# Patient Record
Sex: Male | Born: 1970 | Race: Black or African American | Hispanic: No | Marital: Married | Smoking: Current every day smoker
Health system: Southern US, Community
[De-identification: ages and names within clinical notes are randomized; demographics above are authoritative.]

## PROBLEM LIST (undated history)

## (undated) DIAGNOSIS — F1290 Cannabis use, unspecified, uncomplicated: Secondary | ICD-10-CM

## (undated) DIAGNOSIS — I10 Essential (primary) hypertension: Secondary | ICD-10-CM

## (undated) DIAGNOSIS — F1010 Alcohol abuse, uncomplicated: Secondary | ICD-10-CM

## (undated) DIAGNOSIS — R002 Palpitations: Secondary | ICD-10-CM

## (undated) DIAGNOSIS — Z720 Tobacco use: Secondary | ICD-10-CM

## (undated) DIAGNOSIS — I340 Nonrheumatic mitral (valve) insufficiency: Secondary | ICD-10-CM

## (undated) HISTORY — DX: Palpitations: R00.2

## (undated) HISTORY — DX: Alcohol abuse, uncomplicated: F10.10

## (undated) HISTORY — DX: Tobacco use: Z72.0

## (undated) HISTORY — DX: Cannabis use, unspecified, uncomplicated: F12.90

## (undated) HISTORY — DX: Nonrheumatic mitral (valve) insufficiency: I34.0

---

## 2019-10-09 ENCOUNTER — Other Ambulatory Visit: Payer: Self-pay

## 2019-10-09 ENCOUNTER — Emergency Department: Payer: Self-pay | Admitting: Anesthesiology

## 2019-10-09 ENCOUNTER — Encounter: Payer: Self-pay | Admitting: Emergency Medicine

## 2019-10-09 ENCOUNTER — Inpatient Hospital Stay: Payer: Self-pay

## 2019-10-09 ENCOUNTER — Inpatient Hospital Stay
Admission: EM | Admit: 2019-10-09 | Discharge: 2019-10-15 | DRG: 915 | Disposition: A | Discharge status: Home / Self Care | Payer: Self-pay | Attending: Internal Medicine | Admitting: Internal Medicine

## 2019-10-09 ENCOUNTER — Encounter
Admission: EM | Disposition: A | Discharge status: Home / Self Care | Payer: Self-pay | Source: Home / Self Care | Attending: Pulmonary Disease

## 2019-10-09 DIAGNOSIS — X58XXXA Exposure to other specified factors, initial encounter: Secondary | ICD-10-CM | POA: Diagnosis present

## 2019-10-09 DIAGNOSIS — Z781 Physical restraint status: Secondary | ICD-10-CM

## 2019-10-09 DIAGNOSIS — Z20822 Contact with and (suspected) exposure to covid-19: Secondary | ICD-10-CM | POA: Diagnosis present

## 2019-10-09 DIAGNOSIS — J690 Pneumonitis due to inhalation of food and vomit: Secondary | ICD-10-CM | POA: Diagnosis not present

## 2019-10-09 DIAGNOSIS — I10 Essential (primary) hypertension: Secondary | ICD-10-CM

## 2019-10-09 DIAGNOSIS — T464X5A Adverse effect of angiotensin-converting-enzyme inhibitors, initial encounter: Secondary | ICD-10-CM

## 2019-10-09 DIAGNOSIS — T783XXA Angioneurotic edema, initial encounter: Principal | ICD-10-CM | POA: Diagnosis present

## 2019-10-09 DIAGNOSIS — R1310 Dysphagia, unspecified: Secondary | ICD-10-CM | POA: Diagnosis present

## 2019-10-09 DIAGNOSIS — F1210 Cannabis abuse, uncomplicated: Secondary | ICD-10-CM | POA: Diagnosis present

## 2019-10-09 DIAGNOSIS — F17200 Nicotine dependence, unspecified, uncomplicated: Secondary | ICD-10-CM | POA: Diagnosis present

## 2019-10-09 DIAGNOSIS — F10239 Alcohol dependence with withdrawal, unspecified: Secondary | ICD-10-CM | POA: Diagnosis present

## 2019-10-09 DIAGNOSIS — J9600 Acute respiratory failure, unspecified whether with hypoxia or hypercapnia: Secondary | ICD-10-CM | POA: Diagnosis present

## 2019-10-09 HISTORY — PX: INTUBATION-ENDOTRACHEAL WITH TRACHEOSTOMY STANDBY: SHX6592

## 2019-10-09 HISTORY — DX: Essential (primary) hypertension: I10

## 2019-10-09 LAB — RESPIRATORY PANEL BY RT PCR (FLU A&B, COVID)
Influenza A by PCR: NEGATIVE
Influenza B by PCR: NEGATIVE
SARS Coronavirus 2 by RT PCR: NEGATIVE

## 2019-10-09 LAB — BLOOD GAS, ARTERIAL
Acid-base deficit: 0 mmol/L (ref 0.0–2.0)
Bicarbonate: 26.4 mmol/L (ref 20.0–28.0)
FIO2: 0.4
MECHVT: 500 mL
O2 Saturation: 98.8 %
PEEP: 5 cmH2O
Patient temperature: 37
RATE: 15 resp/min
pCO2 arterial: 49 mmHg — ABNORMAL HIGH (ref 32.0–48.0)
pH, Arterial: 7.34 — ABNORMAL LOW (ref 7.350–7.450)
pO2, Arterial: 131 mmHg — ABNORMAL HIGH (ref 83.0–108.0)

## 2019-10-09 LAB — BASIC METABOLIC PANEL
Anion gap: 13 (ref 5–15)
BUN: 14 mg/dL (ref 6–20)
CO2: 24 mmol/L (ref 22–32)
Calcium: 9.6 mg/dL (ref 8.9–10.3)
Chloride: 98 mmol/L (ref 98–111)
Creatinine, Ser: 0.76 mg/dL (ref 0.61–1.24)
GFR calc Af Amer: >60 mL/min (ref >60)
GFR calc non Af Amer: >60 mL/min (ref >60)
Glucose, Bld: 111 mg/dL — ABNORMAL HIGH (ref 70–99)
Potassium: 3.6 mmol/L (ref 3.5–5.1)
Sodium: 135 mmol/L (ref 135–145)

## 2019-10-09 LAB — CBC
HCT: 39.5 % (ref 39.0–52.0)
Hemoglobin: 13.6 g/dL (ref 13.0–17.0)
MCH: 30.6 pg (ref 26.0–34.0)
MCHC: 34.4 g/dL (ref 30.0–36.0)
MCV: 89 fL (ref 80.0–100.0)
Platelets: 162 10*3/uL (ref 150–400)
RBC: 4.44 MIL/uL (ref 4.22–5.81)
RDW: 15.5 % (ref 11.5–15.5)
WBC: 6.2 10*3/uL (ref 4.0–10.5)
nRBC: 0 % (ref 0.0–0.2)

## 2019-10-09 LAB — GLUCOSE, CAPILLARY: Glucose-Capillary: 145 mg/dL — ABNORMAL HIGH (ref 70–99)

## 2019-10-09 LAB — MRSA PCR SCREENING: MRSA by PCR: NEGATIVE

## 2019-10-09 SURGERY — INTUBATION-ENDOTRACHEAL WITH TRACHEOSTOMY STANDBY
Anesthesia: General | Site: Mouth

## 2019-10-09 MED ORDER — MIDAZOLAM HCL 2 MG/2ML IJ SOLN
2.0000 mg | Freq: Once | INTRAMUSCULAR | Status: AC
  Administered 2019-10-09: 2 mg via INTRAVENOUS
  Filled 2019-10-09: qty 2

## 2019-10-09 MED ORDER — HEPARIN SODIUM (PORCINE) 5000 UNIT/ML IJ SOLN: 5000.0000 [IU] | Freq: Three times a day (TID) | INTRAMUSCULAR | Status: DC

## 2019-10-09 MED ORDER — METHYLPREDNISOLONE SODIUM SUCC 125 MG IJ SOLR
80.0000 mg | Freq: Two times a day (BID) | INTRAMUSCULAR | Status: DC
  Administered 2019-10-09: 80 mg via INTRAVENOUS
  Filled 2019-10-09: qty 2

## 2019-10-09 MED ORDER — ORAL CARE MOUTH RINSE
15.0000 mL | OROMUCOSAL | Status: DC
  Administered 2019-10-09 – 2019-10-14 (×39): 15 mL via OROMUCOSAL

## 2019-10-09 MED ORDER — PROPOFOL 1000 MG/100ML IV EMUL
0.0000 ug/kg/min | INTRAVENOUS | Status: DC
  Administered 2019-10-09 – 2019-10-10 (×2): 50 ug/kg/min via INTRAVENOUS
  Filled 2019-10-09 (×2): qty 100

## 2019-10-09 MED ORDER — DEXAMETHASONE SODIUM PHOSPHATE 10 MG/ML IJ SOLN
INTRAMUSCULAR | Status: DC | PRN
  Administered 2019-10-09: 20 mg via INTRAVENOUS

## 2019-10-09 MED ORDER — FAMOTIDINE IN NACL 20-0.9 MG/50ML-% IV SOLN
20.0000 mg | INTRAVENOUS | Status: DC
  Administered 2019-10-09 – 2019-10-14 (×5): 20 mg via INTRAVENOUS
  Filled 2019-10-09 (×5): qty 50

## 2019-10-09 MED ORDER — FENTANYL CITRATE (PF) 100 MCG/2ML IJ SOLN
50.0000 ug | INTRAMUSCULAR | Status: DC | PRN
  Administered 2019-10-09 (×2): 100 ug via INTRAVENOUS
  Administered 2019-10-09: 200 ug via INTRAVENOUS
  Filled 2019-10-09: qty 4
  Filled 2019-10-09 (×2): qty 2

## 2019-10-09 MED ORDER — FENTANYL CITRATE (PF) 100 MCG/2ML IJ SOLN
INTRAMUSCULAR | Status: DC | PRN
  Administered 2019-10-09: 100 ug via INTRAVENOUS

## 2019-10-09 MED ORDER — DIPHENHYDRAMINE HCL 50 MG/ML IJ SOLN
50.0000 mg | Freq: Once | INTRAMUSCULAR | Status: AC
  Administered 2019-10-09: 18:00:00 50 mg via INTRAVENOUS

## 2019-10-09 MED ORDER — ALBUTEROL SULFATE (2.5 MG/3ML) 0.083% IN NEBU: 2.5000 mg | INHALATION_SOLUTION | RESPIRATORY_TRACT | Status: DC | PRN

## 2019-10-09 MED ORDER — MIDAZOLAM HCL 2 MG/2ML IJ SOLN
INTRAMUSCULAR | Status: DC | PRN
  Administered 2019-10-09: 2 mg via INTRAVENOUS

## 2019-10-09 MED ORDER — PROPOFOL 10 MG/ML IV BOLUS
INTRAVENOUS | Status: AC
  Filled 2019-10-09: qty 20

## 2019-10-09 MED ORDER — OXYMETAZOLINE HCL 0.05 % NA SOLN
3.0000 | Freq: Once | NASAL | Status: AC
  Administered 2019-10-09: 3 via NASAL
  Filled 2019-10-09: qty 30

## 2019-10-09 MED ORDER — DEXMEDETOMIDINE HCL IN NACL 80 MCG/20ML IV SOLN
INTRAVENOUS | Status: AC
  Filled 2019-10-09: qty 20

## 2019-10-09 MED ORDER — PROPOFOL 1000 MG/100ML IV EMUL
INTRAVENOUS | Status: AC
  Administered 2019-10-09: 20 ug/kg/min via INTRAVENOUS
  Filled 2019-10-09: qty 100

## 2019-10-09 MED ORDER — MIDAZOLAM HCL 2 MG/2ML IJ SOLN
INTRAMUSCULAR | Status: AC
  Filled 2019-10-09: qty 2

## 2019-10-09 MED ORDER — FENTANYL 2500MCG IN NS 250ML (10MCG/ML) PREMIX INFUSION
INTRAVENOUS | Status: AC
  Administered 2019-10-09: 100 ug/h via INTRAVENOUS
  Filled 2019-10-09: qty 250

## 2019-10-09 MED ORDER — CHLORHEXIDINE GLUCONATE 0.12% ORAL RINSE (MEDLINE KIT)
15.0000 mL | Freq: Two times a day (BID) | OROMUCOSAL | Status: DC
  Administered 2019-10-09 – 2019-10-13 (×8): 15 mL via OROMUCOSAL

## 2019-10-09 MED ORDER — SUCCINYLCHOLINE CHLORIDE 20 MG/ML IJ SOLN
INTRAMUSCULAR | Status: DC | PRN
  Administered 2019-10-09: 120 mg via INTRAVENOUS

## 2019-10-09 MED ORDER — OXYMETAZOLINE HCL 0.05 % NA SOLN
NASAL | Status: AC
  Filled 2019-10-09: qty 30

## 2019-10-09 MED ORDER — METHYLPREDNISOLONE SODIUM SUCC 125 MG IJ SOLR
125.0000 mg | INTRAMUSCULAR | Status: AC
  Administered 2019-10-09: 125 mg via INTRAVENOUS

## 2019-10-09 MED ORDER — FENTANYL CITRATE (PF) 100 MCG/2ML IJ SOLN: 50.0000 ug | INTRAMUSCULAR | Status: DC | PRN

## 2019-10-09 MED ORDER — CHLORHEXIDINE GLUCONATE CLOTH 2 % EX PADS
6.0000 | MEDICATED_PAD | Freq: Every day | CUTANEOUS | Status: DC
  Administered 2019-10-09 – 2019-10-15 (×6): 6 via TOPICAL

## 2019-10-09 MED ORDER — FENTANYL CITRATE (PF) 100 MCG/2ML IJ SOLN
INTRAMUSCULAR | Status: AC
  Filled 2019-10-09: qty 2

## 2019-10-09 MED ORDER — EPINEPHRINE 0.3 MG/0.3ML IJ SOAJ
0.3000 mg | Freq: Once | INTRAMUSCULAR | Status: AC
  Administered 2019-10-09: 0.3 mg via INTRAMUSCULAR

## 2019-10-09 MED ORDER — LACTATED RINGERS IV SOLN: INTRAVENOUS | Status: DC | PRN

## 2019-10-09 MED ORDER — FENTANYL 2500MCG IN NS 250ML (10MCG/ML) PREMIX INFUSION
0.0000 ug/h | INTRAVENOUS | Status: DC
  Administered 2019-10-10 (×2): 400 ug/h via INTRAVENOUS
  Administered 2019-10-10: 350 ug/h via INTRAVENOUS
  Administered 2019-10-11 – 2019-10-13 (×10): 400 ug/h via INTRAVENOUS
  Filled 2019-10-09 (×13): qty 250

## 2019-10-09 MED ORDER — SODIUM CHLORIDE 0.9 % IV SOLN
INTRAVENOUS | Status: DC
  Administered 2019-10-09: 21:00:00 50 mL/h via INTRAVENOUS

## 2019-10-09 MED ORDER — MIDAZOLAM HCL 2 MG/2ML IJ SOLN: 2.0000 mg | Freq: Once | INTRAMUSCULAR | Status: AC

## 2019-10-09 MED ORDER — MIDAZOLAM HCL 2 MG/2ML IJ SOLN
INTRAMUSCULAR | Status: AC
  Administered 2019-10-09: 2 mg via INTRAVENOUS
  Filled 2019-10-09: qty 2

## 2019-10-09 MED ORDER — ROCURONIUM BROMIDE 100 MG/10ML IV SOLN
INTRAVENOUS | Status: DC | PRN
  Administered 2019-10-09: 50 mg via INTRAVENOUS

## 2019-10-09 MED ORDER — PROPOFOL 10 MG/ML IV BOLUS
INTRAVENOUS | Status: DC | PRN
  Administered 2019-10-09: 200 mg via INTRAVENOUS

## 2019-10-09 SURGICAL SUPPLY — 15 items
BLADE SURG 15 STRL LF DISP TIS (BLADE) ×1 IMPLANT
BLADE SURG 15 STRL SS (BLADE)
BLADE SURG SZ11 CARB STEEL (BLADE) ×1 IMPLANT
CANISTER SUCT 1200ML W/VALVE (MISCELLANEOUS) ×1 IMPLANT
COVER WAND RF STERILE (DRAPES) ×1 IMPLANT
ELECT REM PT RETURN 9FT ADLT (ELECTROSURGICAL) ×2
ELECTRODE REM PT RTRN 9FT ADLT (ELECTROSURGICAL) ×1 IMPLANT
GLOVE BIO SURGEON STRL SZ7.5 (GLOVE) ×2 IMPLANT
GOWN STRL REUS W/ TWL LRG LVL3 (GOWN DISPOSABLE) ×2 IMPLANT
GOWN STRL REUS W/TWL LRG LVL3 (GOWN DISPOSABLE) ×4
KIT TURNOVER KIT A (KITS) ×2 IMPLANT
PACK HEAD/NECK (MISCELLANEOUS) ×2 IMPLANT
SHEARS FOC LG CVD HARMONIC 17C (MISCELLANEOUS) IMPLANT
SPONGE DRAIN TRACH 4X4 STRL 2S (GAUZE/BANDAGES/DRESSINGS) ×1 IMPLANT
TUBE TRACH SHILEY  6 DIST  CUF (TUBING) IMPLANT

## 2019-10-09 NOTE — Anesthesia Procedure Notes (Signed)
Procedure Name: Intubation Date/Time: 10/09/2019 7:13 PM Performed by: Waldo Laine, CRNA Pre-anesthesia Checklist: Patient identified, Patient being monitored, Timeout performed, Emergency Drugs available and Suction available Patient Re-evaluated:Patient Re-evaluated prior to induction Oxygen Delivery Method: Circle system utilized Preoxygenation: Pre-oxygenation with 100% oxygen Induction Type: IV induction Ventilation: Mask ventilation with difficulty Laryngoscope Size: 3 and Glidescope Grade View: Grade I Tube type: Oral Tube size: 7.0 mm Number of attempts: 1 Airway Equipment and Method: Stylet and Bougie stylet Placement Confirmation: ETT inserted through vocal cords under direct vision,  positive ETCO2 and breath sounds checked- equal and bilateral Secured at: 22 cm Tube secured with: Tape Dental Injury: Teeth and Oropharynx as per pre-operative assessment  Difficulty Due To: Difficult Airway-  due to edematous airway

## 2019-10-09 NOTE — Progress Notes (Signed)
eLink Physician-Brief Progress Note Patient Name: Michael Ingram DOB: [birth year] MRN: [id number]   Date of Service  10/09/2019  HPI/Events of Note  Pt intubated for angioedema and now sub-optimally sedated on Propofol, dyssynchronous despite PRN Fentanyl pushes.  eICU Interventions  Fentanyl infusion ordered. Versed 2 mg iv x 1 PRN extreme agitation prior to Fentanyl fully kicking in. Soft bilateral wrist restraints ordered to prevent self-extubation.        Migdalia Dk 10/09/2019, 9:46 PM

## 2019-10-09 NOTE — Progress Notes (Addendum)
Pt wife has called to check on patient and was updated and informed about the non violent restraints applied. Wife also mentioned that patient drinks 1/2 a 5th of liquor a day.

## 2019-10-09 NOTE — Transfer of Care (Signed)
Immediate Anesthesia Transfer of Care Note  Patient: Michael Ingram  Procedure(s) Performed: INTUBATION-ENDOTRACHEAL WITH TRACHEOSTOMY STANDBY (N/A Mouth)  Patient Location: PACU and ICU  Anesthesia Type:General  Level of Consciousness: Patient remains intubated per anesthesia plan  Airway & Oxygen Therapy: Patient placed on Ventilator (see vital sign flow sheet for setting)  Post-op Assessment: Report given to RN and Post -op Vital signs reviewed and stable  Post vital signs: Reviewed and stable  Last Vitals:  Vitals Value Taken Time  BP    Temp    Pulse 87 10/09/19 1955  Resp 13 10/09/19 1955  SpO2 100 % 10/09/19 1955  Vitals shown include unvalidated device data.  Last Pain:  Vitals:   10/09/19 1813  TempSrc: Oral  PainSc:          Complications: No apparent anesthesia complications

## 2019-10-09 NOTE — OR Nursing (Signed)
Physician obtained emergent consent

## 2019-10-09 NOTE — ED Triage Notes (Signed)
Pt in via POV, with possible allergic reaction to unknown source.  Pt reports feeling as if his throat is closing, having difficulty speaking in full sentences, voice hoarse.  Pt roomed at this time.  Denies hx of same.

## 2019-10-09 NOTE — ED Provider Notes (Signed)
[facility] Emergency Department Provider Note   ____________________________________________   First MD Initiated Contact with Patient 10/09/19 1806     (approximate)  I have reviewed the triage vital signs and the nursing notes.   HISTORY  Chief Complaint Allergic Reaction  EM caveat: Airway compromise, distress for acuity  HPI Michael Ingram is a 49 y.o. male   reports about an hour ago he started having difficulty swallowing.  Thought it might be something due to eaten a different type of spice.  He is having difficulty swallowing feels like he has to spit up.  And feels swollen around his neck.  Also noticed one of his fingers and swollen  Never had this happen before.  Restarted blood pressure medicine today, wife reports that his lisinopril which she had not taken blood pressure medicine for months previous  No chest pain or trouble breathing.  No itching.  Reports biggest issue seems to be difficulty swallowing and feeling like his throat is swollen  Past Medical History:  Diagnosis Date  . Hypertension     There are no problems to display for this patient.   History reviewed. No pertinent surgical history.  Prior to Admission medications   Not on File    Allergies Patient has no known allergies.  No family history on file.  Social History Social History   Tobacco Use  . Smoking status: Current Every Day Smoker    Packs/day: 0.50    Types: Cigarettes  . Smokeless tobacco: Never Used  Substance Use Topics  . Alcohol use: Yes  . Drug use: Yes    Types: Marijuana    Review of Systems  Denies exposure to coronavirus.  No cough fevers chills or other symptoms preceding this.   ____________________________________________   PHYSICAL EXAM:  VITAL SIGNS: ED Triage Vitals  Enc Vitals Group     BP 10/09/19 1802 (!) 194/109     Pulse Rate 10/09/19 1802 (!) 104     Resp 10/09/19 1802 20     Temp 10/09/19 1813 98.2 F  (36.8 C)     Temp Source 10/09/19 1813 Oral     SpO2 10/09/19 1802 100 %     Weight 10/09/19 1803 175 lb (79.4 kg)     Height 10/09/19 1803 5\' 11"  (1.803 m)     Head Circumference --      Peak Flow --      Pain Score 10/09/19 1803 0     Pain Loc --      Pain Edu? --      Excl. in GC? --     Constitutional: Alert and oriented.  Sitting up, appears in some distress spitting up small amounts of spit but respirations are even and unlabored without stridor Eyes: Conjunctivae are normal. Head: Atraumatic. Nose: No congestion/rhinnorhea. Mouth/Throat: Mucous membranes are moist.  Uvula appears slightly edematous Neck: No stridor.  Anterior neck seems to have some slight fullness about it Cardiovascular: Normal rate, regular rhythm. Grossly normal heart sounds.  Good peripheral circulation. Respiratory: Normal respiratory effort.  No retractions. Lungs CTAB. Gastrointestinal: Soft and nontender. No distention. Musculoskeletal: No lower extremity tenderness nor edema.  He has 1 finger pad on his left hand that seems just slightly edematous that he is noticed as well occurring in the same timeframe Neurologic:  Normal speech and language. No gross focal neurologic deficits are appreciated.  Skin:  Skin is warm, dry and intact. No rash noted.  No urticaria or  rash. Psychiatric: Mood and affect are normal. Speech and behavior are normal.  ____________________________________________   LABS (all labs ordered are listed, but only abnormal results are displayed)  Labs Reviewed  BASIC METABOLIC PANEL - Abnormal; Notable for the following components:      Result Value   Glucose, Bld 111 (*)    All other components within normal limits  RESPIRATORY PANEL BY RT PCR (FLU A&B, COVID)  CBC   ____________________________________________  EKG   ____________________________________________  RADIOLOGY   ____________________________________________   PROCEDURES  Procedure(s) performed:  None  Procedures  Critical Care performed: Yes, see critical care note(s)  CRITICAL CARE Performed by: Delman Kitten   Total critical care time: 35 minutes  Critical care time was exclusive of separately billable procedures and treating other patients.  Critical care was necessary to treat or prevent imminent or life-threatening deterioration.  Critical care was time spent personally by me on the following activities: development of treatment plan with patient and/or surrogate as well as nursing, discussions with consultants, evaluation of patient's response to treatment, examination of patient, obtaining history from patient or surrogate, ordering and performing treatments and interventions, ordering and review of laboratory studies, ordering and review of radiographic studies, pulse oximetry and re-evaluation of patient's condition.  ____________________________________________   INITIAL IMPRESSION / ASSESSMENT AND PLAN / ED COURSE  Pertinent labs & imaging results that were available during my care of the patient were reviewed by me and considered in my medical decision making (see chart for details).   Acute upper airway airway difficulty.  Denies foreign body sensation but reports any swelling feeling.  Does appear to have evidence of at least some angioedema at the uvula.  Treated with typical treatments for anaphylaxis, though once full history is gathered and able to speak to his wife it appears that he is started on lisinopril and most likely this is ACE inhibitor induced angioedema  No acute cardiac or pulmonary symptoms.  No neurologic symptoms.  Clinical Course as of Oct 08 1838  Tue Oct 09, 2019  5329 Although the patient is respirating sufficiently, I am concerned as he is obviously having some difficulty swallowing and spitting up slightly.  He reports to me that he thinks the blood pressure medicine that he just restarted today after being off of for a while might be  lisinopril.  I am highly concerned about the possibility of angioedema possibly induced by ACE inhibitor, as he does not have any urticaria, denies itching he has clear lung sounds and no known previous allergies to any foods or medications.  Treating for possible anaphylaxis nonetheless, though angioedema seems high in the differential.  Airway cart and supplies placed outside the doorway in case the patient does decompensate.  Stat consult to ENT has been placed at this time as well.   [MQ]  1813 Dr. Tami Ribas (ENT) coming for emergent consult.    [MQ]  1829 Dr. Tami Ribas at bedside   [MQ]    Clinical Course User Index [MQ] Delman Kitten, MD   ----------------------------------------- 6:51 PM on 10/09/2019 -----------------------------------------  Patient reports that his congestion and difficulty breathing seem to be slightly relieved.  He sitting up respirating comfortably he is going to the operating room with Dr. Tami Ribas for upper airway evaluation and possible intubation.  I discussed with Dr. Tami Ribas who is elected to take the patient to the OR for further evaluation.  I am in agreement that at this time the patient does not need emergent intubation in  the emergency room but rather needs directed care and consultation with Dr. Jenne Campus in the anesthesia team as he does appear to be stable enough to endure evaluation in the OR and not needing emergent Er inbtubation at at this time, especially as the patient reporting some improvement and examination appears to demonstrate stability during his ED timeframe  I have paged critical care medicine at [phone]  to notify them of patient going to the OR and probable need for admission to their service. CCM returned paged and case discussed with Dr. Pricilla Loveless was evaluated in Emergency Department on 10/09/2019 for the symptoms described in the history of present illness. He was evaluated in the context of the global COVID-19 pandemic, which  necessitated consideration that the patient might be at risk for infection with the SARS-CoV-2 virus that causes COVID-19. Institutional protocols and algorithms that pertain to the evaluation of patients at risk for COVID-19 are in a state of rapid change based on information released by regulatory bodies including the CDC and federal and state organizations. These policies and algorithms were followed during the patient's care in the ED.  Urgency did not allow for return of Covid test prior to going to the OR but the patient does deny exposure or symptoms of coronavirus disease to my evaluation.  ICU consultation discussed with Dr. Tim Lair at 730pm (some delay as CCM pager was not able to be answered)  ____________________________________________   FINAL CLINICAL IMPRESSION(S) / ED DIAGNOSES  Final diagnoses:  Angioedema, initial encounter        Note:  This document was prepared using Dragon voice recognition software and may include unintentional dictation errors       Sharyn Creamer, MD 10/09/19 2002

## 2019-10-09 NOTE — Progress Notes (Signed)
eLink Physician-Brief Progress Note Patient Name: Michael Ingram DOB: [birth year] MRN: [id number]   Date of Service  10/09/2019  HPI/Events of Note  New pt admitted for angioedema likely r/t lisinopril, intubated emergently by anesthesia. Notes reviewed.  PCCM MD en route to admit, bedside team calling for sedation, vent orders post intubation.   eICU Interventions  PAD sedation protocol  Vent orders  Solumedrol, pepcid      Intervention Category Evaluation Type: New Patient Evaluation  Danford Bad 10/09/2019, 8:41 PM

## 2019-10-09 NOTE — Anesthesia Preprocedure Evaluation (Signed)
Anesthesia Evaluation  Patient identified by MRN, date of birth, ID band Patient awake    Reviewed: Allergy & Precautions, NPO status , Patient's Chart, lab work & pertinent test results  Airway Mallampati: III     Mouth opening: Limited Mouth Opening  Dental  (+) Caps, Chipped   Pulmonary Current Smoker,    Pulmonary exam normal        Cardiovascular hypertension, Pt. on medications Normal cardiovascular exam     Neuro/Psych negative neurological ROS  negative psych ROS   GI/Hepatic negative GI ROS, Neg liver ROS,   Endo/Other  negative endocrine ROS  Renal/GU negative Renal ROS  negative genitourinary   Musculoskeletal negative musculoskeletal ROS (+)   Abdominal Normal abdominal exam  (+)   Peds negative pediatric ROS (+)  Hematology negative hematology ROS (+)   Anesthesia Other Findings Past Medical History: No date: Hypertension  Reproductive/Obstetrics                             Anesthesia Physical Anesthesia Plan  ASA: III and emergent  Anesthesia Plan: General   Post-op Pain Management:    Induction: Intravenous  PONV Risk Score and Plan:   Airway Management Planned: Oral ETT  Additional Equipment:   Intra-op Plan:   Post-operative Plan: Post-operative intubation/ventilation  Informed Consent: I have reviewed the patients History and Physical, chart, labs and discussed the procedure including the risks, benefits and alternatives for the proposed anesthesia with the patient or authorized representative who has indicated his/her understanding and acceptance.     Dental advisory given  Plan Discussed with: CRNA  Anesthesia Plan Comments: (Talked with patient and discussed risks and also informed him and family that he would remain intubated at least overnight +, until swelling goes down.  All understood and agree with plan.)        Anesthesia Quick  Evaluation

## 2019-10-09 NOTE — Op Note (Signed)
10/09/2019  7:44 PM    Terald Sleeper  [id number]   Pre-Op Dx: Angioedema  Post-op Dx: SAME  Proc: Sedation with oral intubation  Surg:  [name]  Anes:  GOT  EBL: 0  Comp: None  Findings: Severe edema of the epiglottis and the arytenoid tissue.  Procedure: Mr. Bamber was identified in the emergency room and taken emergently to the operating room.  Topical decongestant anesthetic of a oxymetazoline and 2% lidocaine solution was instilled in each nostril approximately 1.5 cc was used on each side.  This was in preparation for possible nasal intubation fiberoptically.  The patient was then taken to the operating room with the assistance of Dr. Yves Dill and anesthesia.  Preparation was made for possible emergency tracheostomy.  The patient was then gently sedated.  The glide scope was then used to visualize the larynx; there was significant edema of the epiglottis and the arytenoid tissue.  A bougie was placed through the vocal folds into the trachea and an endotracheal tube was advanced over the bougie.  Both lungs were listened to their excellent breath sounds with good CO2 return.  The endotracheal tube was secured in position.  Dispo:   Airway secured  Plan: We will send to the ICU for continued support for angioedema as per the ICU team.  Would recommend leaving patient intubated for approximately 48 hours before any attempted extubation.  [name]  10/09/2019 7:44 PM

## 2019-10-09 NOTE — Consult Note (Signed)
Michael Ingram, [place] [id number] 1971/07/24 No att. providers found  Reason for Consult: Angioedema  HPI: 49 year old gentleman presents to the emergency room with difficulty swallowing.  He had been prescribed lisinopril earlier and had started taking it this morning.  Subsequently developed difficulty swallowing and feeling as if he had a lump in his throat.  Asked to evaluate for secure airway  Allergies: No Known Allergies  ROS: Review of systems normal other than 12 systems except per HPI.  PMH:  Past Medical History:  Diagnosis Date  . Hypertension     FH: No family history on file.  SH:  Social History   Socioeconomic History  . Marital status: Married    Spouse name: Not on file  . Number of children: Not on file  . Years of education: Not on file  . Highest education level: Not on file  Occupational History  . Not on file  Tobacco Use  . Smoking status: Current Every Day Smoker    Packs/day: 0.50    Types: Cigarettes  . Smokeless tobacco: Never Used  Substance and Sexual Activity  . Alcohol use: Yes  . Drug use: Yes    Types: Marijuana  . Sexual activity: Not on file  Other Topics Concern  . Not on file  Social History Narrative  . Not on file   Social Determinants of Health   Financial Resource Strain:   . Difficulty of Paying Living Expenses: Not on file  Food Insecurity:   . Worried About Programme researcher, broadcasting/film/video in the Last Year: Not on file  . Ran Out of Food in the Last Year: Not on file  Transportation Needs:   . Lack of Transportation (Medical): Not on file  . Lack of Transportation (Non-Medical): Not on file  Physical Activity:   . Days of Exercise per Week: Not on file  . Minutes of Exercise per Session: Not on file  Stress:   . Feeling of Stress : Not on file  Social Connections:   . Frequency of Communication with Friends and Family: Not on file  . Frequency of Social Gatherings with Friends and Family: Not on file  . Attends Religious  Services: Not on file  . Active Member of Clubs or Organizations: Not on file  . Attends Banker Meetings: Not on file  . Marital Status: Not on file  Intimate Partner Violence:   . Fear of Current or Ex-Partner: Not on file  . Emotionally Abused: Not on file  . Physically Abused: Not on file  . Sexually Abused: Not on file    PSH: History reviewed. No pertinent surgical history.  Physical  Exam: Patient awake and alert sitting up in the bed did not have any evidence of tongue edema.  Anterior nose was clear, the external ears were normal.  Lips and teeth were unremarkable.  Palpation the neck was unremarkable.  Procedure: After patient's consent the nose was decongested with oxymetazoline solution.  Approximately 5 minutes a flexible fiberoptic laryngoscope was passed through the nostril.  Examination larynx showed edema of the uvula there was also edema of the epiglottis on the lingual surface as well as edema of the arytenoid.  The true vocal folds appeared within normal limits.  A/P: Angioedema-I had a long talk with Michael Ingram and his wife based on the edema and its progression I recommended an emergent intubation in the operating room with possible tracheostomy.  We discussed risk and benefits including bleeding infection airway obstruction and death.  They are eager to proceed we will take him to the operating room emergently.   Roena Malady 10/09/2019 7:41 PM

## 2019-10-10 LAB — BASIC METABOLIC PANEL
Anion gap: 4 — ABNORMAL LOW (ref 5–15)
BUN: 18 mg/dL (ref 6–20)
CO2: 27 mmol/L (ref 22–32)
Calcium: 8.9 mg/dL (ref 8.9–10.3)
Chloride: 101 mmol/L (ref 98–111)
Creatinine, Ser: 1.17 mg/dL (ref 0.61–1.24)
GFR calc Af Amer: >60 mL/min (ref >60)
GFR calc non Af Amer: >60 mL/min (ref >60)
Glucose, Bld: 164 mg/dL — ABNORMAL HIGH (ref 70–99)
Potassium: 6.1 mmol/L — ABNORMAL HIGH (ref 3.5–5.1)
Sodium: 132 mmol/L — ABNORMAL LOW (ref 135–145)

## 2019-10-10 LAB — CBC
HCT: 37.4 % — ABNORMAL LOW (ref 39.0–52.0)
Hemoglobin: 12.8 g/dL — ABNORMAL LOW (ref 13.0–17.0)
MCH: 31 pg (ref 26.0–34.0)
MCHC: 34.2 g/dL (ref 30.0–36.0)
MCV: 90.6 fL (ref 80.0–100.0)
Platelets: 172 10*3/uL (ref 150–400)
RBC: 4.13 MIL/uL — ABNORMAL LOW (ref 4.22–5.81)
RDW: 15.3 % (ref 11.5–15.5)
WBC: 6.4 10*3/uL (ref 4.0–10.5)
nRBC: 0 % (ref 0.0–0.2)

## 2019-10-10 LAB — HIV ANTIBODY (ROUTINE TESTING W REFLEX): HIV Screen 4th Generation wRfx: NONREACTIVE

## 2019-10-10 LAB — HEPATIC FUNCTION PANEL
ALT: 33 U/L (ref 0–44)
AST: 31 U/L (ref 15–41)
Albumin: 3.6 g/dL (ref 3.5–5.0)
Alkaline Phosphatase: 55 U/L (ref 38–126)
Bilirubin, Direct: <0.1 mg/dL (ref 0.0–0.2)
Total Bilirubin: 0.5 mg/dL (ref 0.3–1.2)
Total Protein: 6.7 g/dL (ref 6.5–8.1)

## 2019-10-10 LAB — TRIGLYCERIDES: Triglycerides: 70 mg/dL (ref <150)

## 2019-10-10 LAB — POTASSIUM: Potassium: 5.3 mmol/L — ABNORMAL HIGH (ref 3.5–5.1)

## 2019-10-10 MED ORDER — THIAMINE HCL 100 MG/ML IJ SOLN
100.0000 mg | Freq: Every day | INTRAMUSCULAR | Status: DC
  Administered 2019-10-10 – 2019-10-13 (×4): 100 mg via INTRAVENOUS
  Filled 2019-10-10 (×4): qty 2

## 2019-10-10 MED ORDER — DEXAMETHASONE SODIUM PHOSPHATE 10 MG/ML IJ SOLN
10.0000 mg | Freq: Two times a day (BID) | INTRAMUSCULAR | Status: DC
  Administered 2019-10-10 – 2019-10-12 (×5): 10 mg via INTRAVENOUS
  Filled 2019-10-10 (×6): qty 1

## 2019-10-10 MED ORDER — DIPHENHYDRAMINE HCL 50 MG/ML IJ SOLN
50.0000 mg | Freq: Three times a day (TID) | INTRAMUSCULAR | Status: DC
  Administered 2019-10-10 – 2019-10-14 (×12): 50 mg via INTRAVENOUS
  Filled 2019-10-10 (×12): qty 1

## 2019-10-10 MED ORDER — SODIUM CHLORIDE 0.9 % IV SOLN
1.5000 g | Freq: Two times a day (BID) | INTRAVENOUS | Status: DC
  Administered 2019-10-10: 1.5 g via INTRAVENOUS
  Filled 2019-10-10: qty 4
  Filled 2019-10-10: qty 1.5
  Filled 2019-10-10: qty 4

## 2019-10-10 MED ORDER — MIDAZOLAM HCL 2 MG/2ML IJ SOLN
2.0000 mg | INTRAMUSCULAR | Status: DC | PRN
  Administered 2019-10-10: 2 mg via INTRAVENOUS
  Filled 2019-10-10: qty 2

## 2019-10-10 MED ORDER — DEXMEDETOMIDINE HCL IN NACL 400 MCG/100ML IV SOLN
0.4000 ug/kg/h | INTRAVENOUS | Status: DC
  Administered 2019-10-10 – 2019-10-13 (×16): 1.2 ug/kg/h via INTRAVENOUS
  Administered 2019-10-13 – 2019-10-14 (×2): 0.4 ug/kg/h via INTRAVENOUS
  Administered 2019-10-14: 0.5 ug/kg/h via INTRAVENOUS
  Administered 2019-10-15: 0.4 ug/kg/h via INTRAVENOUS
  Filled 2019-10-10 (×21): qty 100

## 2019-10-10 MED ORDER — SODIUM CHLORIDE 0.9 % IV SOLN
1.0000 mg | Freq: Once | INTRAVENOUS | Status: AC
  Administered 2019-10-10: 14:00:00 1 mg via INTRAVENOUS
  Filled 2019-10-10: qty 0.2

## 2019-10-10 MED ORDER — SODIUM CHLORIDE 0.9 % IV SOLN
3.0000 g | Freq: Four times a day (QID) | INTRAVENOUS | Status: DC
  Administered 2019-10-10 – 2019-10-15 (×20): 3 g via INTRAVENOUS
  Filled 2019-10-10 (×3): qty 3
  Filled 2019-10-10 (×2): qty 8
  Filled 2019-10-10 (×3): qty 3
  Filled 2019-10-10: qty 8
  Filled 2019-10-10: qty 3
  Filled 2019-10-10: qty 8
  Filled 2019-10-10 (×7): qty 3
  Filled 2019-10-10 (×2): qty 8
  Filled 2019-10-10 (×2): qty 3
  Filled 2019-10-10 (×2): qty 8
  Filled 2019-10-10: qty 3

## 2019-10-10 MED ORDER — MIDAZOLAM 50MG/50ML (1MG/ML) PREMIX INFUSION
0.5000 mg/h | INTRAVENOUS | Status: DC
  Administered 2019-10-10: 2 mg/h via INTRAVENOUS
  Administered 2019-10-11: 14:00:00 4 mg/h via INTRAVENOUS
  Administered 2019-10-11: 5 mg/h via INTRAVENOUS
  Administered 2019-10-12 – 2019-10-13 (×3): 4 mg/h via INTRAVENOUS
  Filled 2019-10-10 (×6): qty 50

## 2019-10-10 MED ORDER — FOLIC ACID 1 MG PO TABS
1.0000 mg | ORAL_TABLET | Freq: Every day | ORAL | Status: DC
  Filled 2019-10-10: qty 1

## 2019-10-10 MED ORDER — MIDAZOLAM HCL 2 MG/2ML IJ SOLN
INTRAMUSCULAR | Status: AC
  Administered 2019-10-10: 2 mg via INTRAVENOUS
  Filled 2019-10-10: qty 2

## 2019-10-10 NOTE — Progress Notes (Signed)
PHARMACY CONSULT NOTE  Pharmacy Consult for Electrolyte Monitoring and Replacement   Recent Labs: Potassium (mmol/L)  Date Value  10/10/2019 5.3 (H)   Calcium (mg/dL)  Date Value  99/35/7017 8.9   Albumin (g/dL)  Date Value  79/39/0300 3.6   Sodium (mmol/L)  Date Value  10/10/2019 132 (L)     Assessment: 49 year old male intubated following angioedema, expected to be related to recent ACE inhibitor start. Current plan is to extubate 1/21.  Goal of Therapy:  Electrolytes WNL  Plan:  Potassium elevated this morning, unclear reason as patient with normal renal function. Repeat level lower although still slightly elevated. Will follow up electrolytes with morning labs and reassess need for treatment.  Pricilla Riffle ,PharmD Clinical Pharmacist 10/10/2019 2:47 PM

## 2019-10-10 NOTE — H&P (Signed)
CRITICAL CARE NOTE    Name: SIAH STEELY MRN: [id number] DOB: [birth year]     LOS: 1   SUBJECTIVE FINDINGS & SIGNIFICANT EVENTS   Patient description:   49 yo M came in to ED due to sensation of dysphagia post prandially.  He reported eating spicy food.  While in ED he reported sensation of neck swelling, also noted digital edema newly formed. Patient started lisinopril today for his BP which has been uncontrolled. He is an active user of high volume EtOH, per wife patient "drinks a firth per night". Patient also uses THC. While in ED it was noted he had upper airway edema and ENT was consulted who also discussed with anesthesia and it was decided to secure airway via ETT.   Lines / Drains: PIVx2  Cultures / Sepsis markers: n/a  Antibiotics: none   Protocols / Consultants: ENT  Tests / Events: none  Overnight: Post intubation   PAST MEDICAL HISTORY   Past Medical History:  Diagnosis Date  . Hypertension      SURGICAL HISTORY   History reviewed. No pertinent surgical history.   FAMILY HISTORY   History reviewed. No pertinent family history.   SOCIAL HISTORY   Social History   Tobacco Use  . Smoking status: Current Every Day Smoker    Packs/day: 0.50    Types: Cigarettes  . Smokeless tobacco: Never Used  Substance Use Topics  . Alcohol use: Yes  . Drug use: Yes    Types: Marijuana     MEDICATIONS   Current Medication:  Current Facility-Administered Medications:  .  0.9 %  sodium chloride infusion, , Intravenous, Continuous, Whiteheart, Kathryn A, NP, Last Rate: 50 mL/hr at 10/09/19 2107, 50 mL/hr at 10/09/19 2107 .  albuterol (PROVENTIL) (2.5 MG/3ML) 0.083% nebulizer solution 2.5 mg, 2.5 mg, Nebulization, Q2H PRN, Whiteheart, Kathryn A, NP .  chlorhexidine gluconate  (MEDLINE KIT) (PERIDEX) 0.12 % solution 15 mL, 15 mL, Mouth Rinse, BID, Lanney Gins, Dailyn Kempner, MD, 15 mL at 10/09/19 2104 .  Chlorhexidine Gluconate Cloth 2 % PADS 6 each, 6 each, Topical, Daily, Beverly Gust, MD, 6 each at 10/09/19 2015 .  famotidine (PEPCID) IVPB 20 mg premix, 20 mg, Intravenous, Q24H, Whiteheart, Kathryn A, NP, Last Rate: 100 mL/hr at 10/09/19 2115, 20 mg at 10/09/19 2115 .  fentaNYL (SUBLIMAZE) injection 50 mcg, 50 mcg, Intravenous, Q15 min PRN, Whiteheart, Kathryn A, NP .  fentaNYL (SUBLIMAZE) injection 50-200 mcg, 50-200 mcg, Intravenous, Q30 min PRN, Whiteheart, Kathryn A, NP, 200 mcg at 10/09/19 2139 .  fentaNYL 2568mg in NS 2562m(1063mml) infusion-PREMIX, 0-400 mcg/hr, Intravenous, Continuous, Ogan, Okoronkwo U, MD, Last Rate: 35 mL/hr at 10/10/19 0456, 350 mcg/hr at 10/10/19 0456 .  MEDLINE mouth rinse, 15 mL, Mouth Rinse, 10 times per day, AleOttie GlazierD, 15 mL at 10/10/19 0639 .  methylPREDNISolone sodium succinate (SOLU-MEDROL) 125 mg/2 mL injection 80 mg, 80 mg, Intravenous, Q12H, Whiteheart, Kathryn A, NP, 80 mg at 10/09/19 2113 .  propofol (DIPRIVAN) 1000 MG/100ML infusion, 0-50 mcg/kg/min, Intravenous, Continuous, Whiteheart, Kathryn A, NP, Last Rate: 17.71 mL/hr at 10/10/19 0639, 40 mcg/kg/min at 10/10/19 [id number] ALLERGIES   Patient has no known allergies.    REVIEW OF SYSTEMS     Unable to obtain patient is sedated on MV  PHYSICAL EXAMINATION   Vital Signs: Temp:  [97.6 F (36.4 C)-98.5 F (36.9 C)] 98.5 F (36.9 C) (01/20 0400) Pulse Rate:  [84-104] 86 (01/19 1956) Resp:  [13-20] 13 (01/19  1956) BP: (147-194)/(101-109) 147/101 (01/19 1956) SpO2:  [98 %-100 %] 98 % (01/20 0318) FiO2 (%):  [35 %-40 %] 35 % (01/20 0318) Weight:  [73.8 kg-79.4 kg] 73.8 kg (01/20 0500)  GENERAL: Lightly sedated and intubated to RASS-0 HEAD: Normocephalic, atraumatic.  EYES: Pupils equal, round, reactive to light.  No scleral icterus.  MOUTH: Moist mucosal  membrane. NECK: Supple. No thyromegaly. No nodules. No JVD.  PULMONARY: CTAB CARDIOVASCULAR: S1 and S2. Regular rate and rhythm. No murmurs, rubs, or gallops.  GASTROINTESTINAL: Soft, nontender, non-distended. No masses. Positive bowel sounds. No hepatosplenomegaly.  MUSCULOSKELETAL: No swelling, clubbing, or edema.  NEUROLOGIC: Mild distress due to acute illness SKIN:intact,warm,dry   PERTINENT DATA     Infusions: . sodium chloride 50 mL/hr (10/09/19 2107)  . famotidine (PEPCID) IV 20 mg (10/09/19 2115)  . fentaNYL infusion INTRAVENOUS 350 mcg/hr (10/10/19 0456)  . propofol (DIPRIVAN) infusion 40 mcg/kg/min (10/10/19 5465)   Scheduled Medications: . chlorhexidine gluconate (MEDLINE KIT)  15 mL Mouth Rinse BID  . Chlorhexidine Gluconate Cloth  6 each Topical Daily  . mouth rinse  15 mL Mouth Rinse 10 times per day  . methylPREDNISolone (SOLU-MEDROL) injection  80 mg Intravenous Q12H   PRN Medications: albuterol, fentaNYL (SUBLIMAZE) injection, fentaNYL (SUBLIMAZE) injection Hemodynamic parameters:   Intake/Output: 01/19 0701 - 01/20 0700 In: 1080.7 [I.V.:1030.7; IV Piggyback:50] Out: 600 [Urine:600]  Ventilator  Settings: Vent Mode: PRVC FiO2 (%):  [35 %-40 %] 35 % Set Rate:  [15 bmp] 15 bmp Vt Set:  [500 mL] 500 mL PEEP:  [5 cmH20] 5 cmH20    LAB RESULTS:  Basic Metabolic Panel: Recent Labs  Lab 10/09/19 1809 10/10/19 0515  NA 135 132*  K 3.6 6.1*  CL 98 101  CO2 24 27  GLUCOSE 111* 164*  BUN 14 18  CREATININE 0.76 1.17  CALCIUM 9.6 8.9   Liver Function Tests: No results for input(s): AST, ALT, ALKPHOS, BILITOT, PROT, ALBUMIN in the last 168 hours. No results for input(s): LIPASE, AMYLASE in the last 168 hours. No results for input(s): AMMONIA in the last 168 hours. CBC: Recent Labs  Lab 10/09/19 1809 10/10/19 0515  WBC 6.2 6.4  HGB 13.6 12.8*  HCT 39.5 37.4*  MCV 89.0 90.6  PLT 162 172   Cardiac Enzymes: No results for input(s): CKTOTAL,  CKMB, CKMBINDEX, TROPONINI in the last 168 hours. BNP: Invalid input(s): POCBNP CBG: Recent Labs  Lab 10/09/19 1951  GLUCAP 145*     IMAGING RESULTS:  Imaging: DG Chest Port 1 View  Result Date: 10/09/2019 CLINICAL DATA:  49 year old male status post intubation. EXAM: PORTABLE CHEST 1 VIEW COMPARISON:  None. FINDINGS: Endotracheal tube with tip approximately 6.8 cm above the carina. Patchy right lung base density may represent atelectasis or infiltrate. Clinical correlation is recommended. There is no pleural effusion or pneumothorax. The cardiac silhouette is within normal is. No acute osseous pathology. IMPRESSION: 1. Endotracheal tube above the carina. 2. Right lung base atelectasis versus infiltrate. Electronically Signed   By: Anner Crete M.D.   On: 10/09/2019 21:15      ASSESSMENT AND PLAN    -Multidisciplinary rounds held today  Aspiration pneumonia  - Unasyn IV -respiratory culutre -bronchopulmonary hygiene  Severe Angioedema with upper airway edema - Famotidine -Dexamethasone 10 BID  - Diphenhyhdramine 50 IV TID -Wean Fio2 and PEEP as tolerated -will perform SAT/SBT when respiratory parameters are me ICU monitoring\ -discussed with ENT - Dr Tami Ribas - appreciate input   Alcohol and [place] abuse  -  ICU telemetry monitoring  - CIWA protocol  -follow chem 7- repelete electrolytes    ID -continue IV abx as prescibed -follow up cultures  GI/Nutrition GI PROPHYLAXIS as indicated DIET-->TF's as tolerated Constipation protocol as indicated  ENDO - ICU hypoglycemic\Hyperglycemia protocol -check FSBS per protocol   ELECTROLYTES -follow labs as needed -replace as needed -pharmacy consultation   DVT/GI PRX ordered -SCDs  TRANSFUSIONS AS NEEDED MONITOR FSBS ASSESS the need for LABS as needed   Critical care provider statement:    Critical care time (minutes):  33   Critical care time was exclusive of:  Separately billable procedures and  treating other patients   Critical care was necessary to treat or prevent imminent or life-threatening deterioration of the following conditions:  severe angioedema,essential HTN, alcoholism, drug abuse, aspiration pneumonia   Critical care was time spent personally by me on the following activities:  Development of treatment plan with patient or surrogate, discussions with consultants, evaluation of patient's response to treatment, examination of patient, obtaining history from patient or surrogate, ordering and performing treatments and interventions, ordering and review of laboratory studies and re-evaluation of patient's condition.  I assumed direction of critical care for this patient from another provider in my specialty: no    This document was prepared using Dragon voice recognition software and may include unintentional dictation errors.    Ottie Glazier, M.D.  Division of [place]

## 2019-10-10 NOTE — Progress Notes (Signed)
Initial Nutrition Assessment  DOCUMENTATION CODES:   Not applicable  INTERVENTION:  Plan is for patient to extubate in the next 24-48 hours.  If patient is unable to extubate in that time frame recommend placement of enteral access and initiation of tube feeds.   If tube feeds are initiated recommend Vital 1.5 Cal at 45 mL/hr (1080 mL goal daily volume) + Pro-Stat 30 mL BID per tube. Provides 1820 kcal, 103 grams of protein, 821 mL H2O daily.  If tube feeds are initiated provide liquid MVI daily and a minimum free water flush of 30 mL Q4hrs to maintain tube patency.  NUTRITION DIAGNOSIS:   Inadequate oral intake related to inability to eat as evidenced by NPO status.  GOAL:   Provide needs based on ASPEN/SCCM guidelines  MONITOR:   Vent status, Labs, Weight trends, I & O's  REASON FOR ASSESSMENT:   Ventilator    ASSESSMENT:   49 year old male with PMHx of HTN, EtOH abuse admitted with severe angioedema.   Patient is currently intubated on ventilator support MV: 7.5 L/min Temp (24hrs), Avg:98.1 F (36.7 C), Min:97.6 F (36.4 C), Max:98.5 F (36.9 C)  Propofol: N/A  Medications reviewed and include: Decadron 10 mg Q12hrs IV, Unasyn, Precedex gtt, famotidine, fentanyl gtt.  Labs reviewed: Sodium 132, Potassium 5.3 (trending down).   Enteral Access: none at this time  Discussed with RN and on rounds. Plan is for extubation within 24-48 hours.  NUTRITION - FOCUSED PHYSICAL EXAM:    Most Recent Value  Orbital Region  No depletion  Upper Arm Region  No depletion  Thoracic and Lumbar Region  No depletion  Buccal Region  Unable to assess  Temple Region  No depletion  Clavicle Bone Region  No depletion  Clavicle and Acromion Bone Region  No depletion  Scapular Bone Region  Unable to assess  Dorsal Hand  No depletion  Patellar Region  Mild depletion  Anterior Thigh Region  Mild depletion  Posterior Calf Region  Mild depletion  Edema (RD Assessment)  None   Hair  Reviewed  Eyes  Unable to assess  Mouth  Unable to assess  Skin  Reviewed  Nails  Reviewed     Diet Order:   Diet Order            Diet NPO time specified  Diet effective now             EDUCATION NEEDS:   No education needs have been identified at this time  Skin:  Skin Assessment: Reviewed RN Assessment  Last BM:  Unknown/PTA  Height:   Ht Readings from Last 1 Encounters:  10/10/19 5' 10.98" (1.803 m)   Weight:   Wt Readings from Last 1 Encounters:  10/10/19 73.8 kg   Ideal Body Weight:  78.2 kg  BMI:  Body mass index is 22.7 kg/m.  Estimated Nutritional Needs:   Kcal:  1750 (PSU 2003b w/ MSJ 1633, Ve 7.5, Tmax 36.9)  Protein:  90-110 grams (1.2-1.5 grams/kg)  Fluid:  2.2-2.5 L/day  Felix Pacini, MS, RD, LDN Office: [phone] Pager: [phone] After Hours/Weekend Pager: [phone]

## 2019-10-10 NOTE — Progress Notes (Signed)
10/10/2019 8:08 AM  Michael Ingram [id number]  Post-Op Day 1    Temp:  [97.6 F (36.4 C)-98.5 F (36.9 C)] 98.5 F (36.9 C) (01/20 0400) Pulse Rate:  [84-104] 86 (01/19 1956) Resp:  [13-20] 13 (01/19 1956) BP: (147-194)/(101-109) 147/101 (01/19 1956) SpO2:  [98 %-100 %] 98 % (01/20 0318) FiO2 (%):  [35 %-40 %] 35 % (01/20 0318) Weight:  [73.8 kg-79.4 kg] 73.8 kg (01/20 0500),     Intake/Output Summary (Last 24 hours) at 10/10/2019 0808 Last data filed at 10/10/2019 0500 Gross per 24 hour  Intake 1080.73 ml  Output 600 ml  Net 480.73 ml    Results for orders placed or performed during the hospital encounter of 10/09/19 (from the past 24 hour(s))  CBC     Status: None   Collection Time: 10/09/19  6:09 PM  Result Value Ref Range   WBC 6.2 4.0 - 10.5 K/uL   RBC 4.44 4.22 - 5.81 MIL/uL   Hemoglobin 13.6 13.0 - 17.0 g/dL   HCT 39.5 39.0 - 52.0 %   MCV 89.0 80.0 - 100.0 fL   MCH 30.6 26.0 - 34.0 pg   MCHC 34.4 30.0 - 36.0 g/dL   RDW 15.5 11.5 - 15.5 %   Platelets 162 150 - 400 K/uL   nRBC 0.0 0.0 - 0.2 %  Basic metabolic panel     Status: Abnormal   Collection Time: 10/09/19  6:09 PM  Result Value Ref Range   Sodium 135 135 - 145 mmol/L   Potassium 3.6 3.5 - 5.1 mmol/L   Chloride 98 98 - 111 mmol/L   CO2 24 22 - 32 mmol/L   Glucose, Bld 111 (H) 70 - 99 mg/dL   BUN 14 6 - 20 mg/dL   Creatinine, Ser 0.76 0.61 - 1.24 mg/dL   Calcium 9.6 8.9 - 10.3 mg/dL   GFR calc non Af Amer >60 >60 mL/min   GFR calc Af Amer >60 >60 mL/min   Anion gap 13 5 - 15  Respiratory Panel by RT PCR (Flu A&B, Covid) - Nasopharyngeal Swab     Status: None   Collection Time: 10/09/19  6:09 PM   Specimen: Nasopharyngeal Swab  Result Value Ref Range   SARS Coronavirus 2 by RT PCR NEGATIVE NEGATIVE   Influenza A by PCR NEGATIVE NEGATIVE   Influenza B by PCR NEGATIVE NEGATIVE  Glucose, capillary     Status: Abnormal   Collection Time: 10/09/19  7:51 PM  Result Value Ref Range    Glucose-Capillary 145 (H) 70 - 99 mg/dL  MRSA PCR Screening     Status: None   Collection Time: 10/09/19  8:00 PM   Specimen: Nasal Mucosa; Nasopharyngeal  Result Value Ref Range   MRSA by PCR NEGATIVE NEGATIVE  Blood gas, arterial     Status: Abnormal   Collection Time: 10/09/19  9:42 PM  Result Value Ref Range   FIO2 0.40    Delivery systems VENTILATOR    Mode PRESSURE REGULATED VOLUME CONTROL    VT 500 mL   LHR 15 resp/min   Peep/cpap 5.0 cm H20   pH, Arterial 7.34 (L) 7.350 - 7.450   pCO2 arterial 49 (H) 32.0 - 48.0 mmHg   pO2, Arterial 131 (H) 83.0 - 108.0 mmHg   Bicarbonate 26.4 20.0 - 28.0 mmol/L   Acid-base deficit 0.0 0.0 - 2.0 mmol/L   O2 Saturation 98.8 %   Patient temperature 37.0    Collection site RIGHT RADIAL  Sample type ARTERIAL DRAW    Allens test (pass/fail) PASS PASS  Triglycerides     Status: None   Collection Time: 10/10/19  5:15 AM  Result Value Ref Range   Triglycerides 70 <150 mg/dL  CBC     Status: Abnormal   Collection Time: 10/10/19  5:15 AM  Result Value Ref Range   WBC 6.4 4.0 - 10.5 K/uL   RBC 4.13 (L) 4.22 - 5.81 MIL/uL   Hemoglobin 12.8 (L) 13.0 - 17.0 g/dL   HCT 19.3 (L) 79.0 - 24.0 %   MCV 90.6 80.0 - 100.0 fL   MCH 31.0 26.0 - 34.0 pg   MCHC 34.2 30.0 - 36.0 g/dL   RDW 97.3 53.2 - 99.2 %   Platelets 172 150 - 400 K/uL   nRBC 0.0 0.0 - 0.2 %  Basic metabolic panel     Status: Abnormal   Collection Time: 10/10/19  5:15 AM  Result Value Ref Range   Sodium 132 (L) 135 - 145 mmol/L   Potassium 6.1 (H) 3.5 - 5.1 mmol/L   Chloride 101 98 - 111 mmol/L   CO2 27 22 - 32 mmol/L   Glucose, Bld 164 (H) 70 - 99 mg/dL   BUN 18 6 - 20 mg/dL   Creatinine, Ser 4.26 0.61 - 1.24 mg/dL   Calcium 8.9 8.9 - 83.4 mg/dL   GFR calc non Af Amer >60 >60 mL/min   GFR calc Af Amer >60 >60 mL/min   Anion gap 4 (L) 5 - 15    SUBJECTIVE:  Intubated, awake and slightly agitated  OBJECTIVE:  ET in position  IMPRESSION:  Angioedema-airway  secure  PLAN:  Would leave intubated until possibly Thursday noon.  Then will recommend we let the cuff down and see if there is a leak, if so then extubation can be attempted.  Would continue anti angioedema meds.    [name] 10/10/2019, 8:08 AM

## 2019-10-10 NOTE — Progress Notes (Signed)
PHARMACY NOTE:  ANTIMICROBIAL RENAL DOSAGE ADJUSTMENT  Current antimicrobial regimen includes a mismatch between antimicrobial dosage and estimated renal function.  As per policy approved by the Pharmacy & Therapeutics and Medical Executive Committees, the antimicrobial dosage will be adjusted accordingly.  Current antimicrobial dosage:  Unasyn 1.5 g IV q12h  Indication: aspiration pneumonia  Renal Function:  Estimated Creatinine Clearance: 80.6 mL/min (by C-G formula based on SCr of 1.17 mg/dL).  Antimicrobial dosage has been changed to:  Unasyn 3 g IV q6h     Thank you for allowing pharmacy to be a part of this patient's care.  Laureen Ochs, PharmD 10/10/2019 3:50 PM

## 2019-10-10 NOTE — Progress Notes (Signed)
CRITICAL CARE NOTE    Name: Michael Ingram MRN: [id number] DOB: 1971/07/22     LOS: 1   SUBJECTIVE FINDINGS & SIGNIFICANT EVENTS   Patient description:   49 yo M came in to ED due to sensation of dysphagia post prandially.  He reported eating spicy food.  While in ED he reported sensation of neck swelling, also noted digital edema newly formed. Patient started lisinopril today for his BP which has been uncontrolled. He is an active user of high volume EtOH, per wife patient "drinks a firth per night". Patient also uses THC. While in ED it was noted he had upper airway edema and ENT was consulted who also discussed with anesthesia and it was decided to secure airway via ETT.   Lines / Drains: PIVx2  Cultures / Sepsis markers: n/a  Antibiotics: none   Protocols / Consultants: ENT  Tests / Events: none  Overnight: Post intubation   PAST MEDICAL HISTORY   Past Medical History:  Diagnosis Date  . Hypertension      SURGICAL HISTORY   History reviewed. No pertinent surgical history.   FAMILY HISTORY   History reviewed. No pertinent family history.   SOCIAL HISTORY   Social History   Tobacco Use  . Smoking status: Current Every Day Smoker    Packs/day: 0.50    Types: Cigarettes  . Smokeless tobacco: Never Used  Substance Use Topics  . Alcohol use: Yes  . Drug use: Yes    Types: Marijuana     MEDICATIONS   Current Medication:  Current Facility-Administered Medications:  .  0.9 %  sodium chloride infusion, , Intravenous, Continuous, Whiteheart, Kathryn A, NP, Last Rate: 50 mL/hr at 10/09/19 2107, 50 mL/hr at 10/09/19 2107 .  albuterol (PROVENTIL) (2.5 MG/3ML) 0.083% nebulizer solution 2.5 mg, 2.5 mg, Nebulization, Q2H PRN, Whiteheart, Kathryn A, NP .  chlorhexidine gluconate  (MEDLINE KIT) (PERIDEX) 0.12 % solution 15 mL, 15 mL, Mouth Rinse, BID, Lanney Gins, Mustapha Colson, MD, 15 mL at 10/09/19 2104 .  Chlorhexidine Gluconate Cloth 2 % PADS 6 each, 6 each, Topical, Daily, Beverly Gust, MD, 6 each at 10/09/19 2015 .  dexamethasone (DECADRON) injection 10 mg, 10 mg, Intravenous, Q12H, Edrian Melucci, MD .  dexmedetomidine (PRECEDEX) 400 MCG/100ML (4 mcg/mL) infusion, 0.4-1.2 mcg/kg/hr, Intravenous, Titrated, Giorgi Debruin, MD .  diphenhydrAMINE (BENADRYL) injection 50 mg, 50 mg, Intravenous, TID, Lanney Gins, Kolbe Delmonaco, MD .  famotidine (PEPCID) IVPB 20 mg premix, 20 mg, Intravenous, Q24H, Whiteheart, Kathryn A, NP, Last Rate: 100 mL/hr at 10/09/19 2115, 20 mg at 10/09/19 2115 .  fentaNYL (SUBLIMAZE) injection 50 mcg, 50 mcg, Intravenous, Q15 min PRN, Whiteheart, Kathryn A, NP .  fentaNYL (SUBLIMAZE) injection 50-200 mcg, 50-200 mcg, Intravenous, Q30 min PRN, Whiteheart, Kathryn A, NP, 200 mcg at 10/09/19 2139 .  fentaNYL 2579mg in NS 2572m(1015mml) infusion-PREMIX, 0-400 mcg/hr, Intravenous, Continuous, Ogan, Okoronkwo U, MD, Last Rate: 35 mL/hr at 10/10/19 0456, 350 mcg/hr at 10/10/19 0456 .  MEDLINE mouth rinse, 15 mL, Mouth Rinse, 10 times per day, AleOttie GlazierD, 15 mL at 10/10/19 0639 .  midazolam (VERSED) injection 2 mg, 2 mg, Intravenous, Q2H PRN, AleOttie GlazierD, 2 mg at 10/10/19 0811 .  propofol (DIPRIVAN) 1000 MG/100ML infusion, 0-50 mcg/kg/min, Intravenous, Continuous, Whiteheart, Kathryn A, NP, Last Rate: 17.71 mL/hr at 10/10/19 0639, 40 mcg/kg/min at 10/10/19 [id number] ALLERGIES   Patient has no known allergies.    REVIEW OF SYSTEMS     Unable to obtain  patient is sedated on MV  PHYSICAL EXAMINATION   Vital Signs: Temp:  [97.6 F (36.4 C)-98.5 F (36.9 C)] 98.5 F (36.9 C) (01/20 0400) Pulse Rate:  [84-104] 86 (01/19 1956) Resp:  [13-20] 13 (01/19 1956) BP: (147-194)/(101-109) 147/101 (01/19 1956) SpO2:  [98 %-100 %] 98 % (01/20  0318) FiO2 (%):  [35 %-40 %] 35 % (01/20 0318) Weight:  [73.8 kg-79.4 kg] 73.8 kg (01/20 0500)  GENERAL: Lightly sedated and intubated to RASS-0 HEAD: Normocephalic, atraumatic.  EYES: Pupils equal, round, reactive to light.  No scleral icterus.  MOUTH: Moist mucosal membrane. NECK: Supple. No thyromegaly. No nodules. No JVD.  PULMONARY: CTAB CARDIOVASCULAR: S1 and S2. Regular rate and rhythm. No murmurs, rubs, or gallops.  GASTROINTESTINAL: Soft, nontender, non-distended. No masses. Positive bowel sounds. No hepatosplenomegaly.  MUSCULOSKELETAL: No swelling, clubbing, or edema.  NEUROLOGIC: Mild distress due to acute illness SKIN:intact,warm,dry   PERTINENT DATA     Infusions: . sodium chloride 50 mL/hr (10/09/19 2107)  . dexmedetomidine (PRECEDEX) IV infusion    . famotidine (PEPCID) IV 20 mg (10/09/19 2115)  . fentaNYL infusion INTRAVENOUS 350 mcg/hr (10/10/19 0456)  . propofol (DIPRIVAN) infusion 40 mcg/kg/min (10/10/19 2202)   Scheduled Medications: . chlorhexidine gluconate (MEDLINE KIT)  15 mL Mouth Rinse BID  . Chlorhexidine Gluconate Cloth  6 each Topical Daily  . dexamethasone (DECADRON) injection  10 mg Intravenous Q12H  . diphenhydrAMINE  50 mg Intravenous TID  . mouth rinse  15 mL Mouth Rinse 10 times per day   PRN Medications: albuterol, fentaNYL (SUBLIMAZE) injection, fentaNYL (SUBLIMAZE) injection, midazolam Hemodynamic parameters:   Intake/Output: 01/19 0701 - 01/20 0700 In: 1080.7 [I.V.:1030.7; IV Piggyback:50] Out: 600 [Urine:600]  Ventilator  Settings: Vent Mode: PRVC FiO2 (%):  [35 %-40 %] 35 % Set Rate:  [15 bmp] 15 bmp Vt Set:  [500 mL] 500 mL PEEP:  [5 cmH20] 5 cmH20    LAB RESULTS:  Basic Metabolic Panel: Recent Labs  Lab 10/09/19 1809 10/10/19 0515  NA 135 132*  K 3.6 6.1*  CL 98 101  CO2 24 27  GLUCOSE 111* 164*  BUN 14 18  CREATININE 0.76 1.17  CALCIUM 9.6 8.9   Liver Function Tests: No results for input(s): AST, ALT,  ALKPHOS, BILITOT, PROT, ALBUMIN in the last 168 hours. No results for input(s): LIPASE, AMYLASE in the last 168 hours. No results for input(s): AMMONIA in the last 168 hours. CBC: Recent Labs  Lab 10/09/19 1809 10/10/19 0515  WBC 6.2 6.4  HGB 13.6 12.8*  HCT 39.5 37.4*  MCV 89.0 90.6  PLT 162 172   Cardiac Enzymes: No results for input(s): CKTOTAL, CKMB, CKMBINDEX, TROPONINI in the last 168 hours. BNP: Invalid input(s): POCBNP CBG: Recent Labs  Lab 10/09/19 1951  GLUCAP 145*     IMAGING RESULTS:  Imaging: DG Chest Port 1 View  Result Date: 10/09/2019 CLINICAL DATA:  49 year old male status post intubation. EXAM: PORTABLE CHEST 1 VIEW COMPARISON:  None. FINDINGS: Endotracheal tube with tip approximately 6.8 cm above the carina. Patchy right lung base density may represent atelectasis or infiltrate. Clinical correlation is recommended. There is no pleural effusion or pneumothorax. The cardiac silhouette is within normal is. No acute osseous pathology. IMPRESSION: 1. Endotracheal tube above the carina. 2. Right lung base atelectasis versus infiltrate. Electronically Signed   By: Anner Crete M.D.   On: 10/09/2019 21:15      ASSESSMENT AND PLAN    -Multidisciplinary rounds held today  Aspiration pneumonia  -  Unasyn IV -respiratory culutre -bronchopulmonary hygiene  Severe Angioedema with upper airway edema - Famotidine -Dexamethasone 10 BID  - Diphenhyhdramine 50 IV TID -Wean Fio2 and PEEP as tolerated -will perform SAT/SBT when respiratory parameters are me ICU monitoring\ -discussed with ENT - Dr Tami Ribas - appreciate input   Alcohol and [place] abuse  - ICU telemetry monitoring  - CIWA protocol  -follow chem 7- repelete electrolytes    ID -continue IV abx as prescibed -follow up cultures  GI/Nutrition GI PROPHYLAXIS as indicated DIET-->TF's as tolerated Constipation protocol as indicated  ENDO - ICU hypoglycemic\Hyperglycemia protocol -check FSBS  per protocol   ELECTROLYTES -follow labs as needed -replace as needed -pharmacy consultation   DVT/GI PRX ordered -SCDs  TRANSFUSIONS AS NEEDED MONITOR FSBS ASSESS the need for LABS as needed   Critical care provider statement:    Critical care time (minutes):  33   Critical care time was exclusive of:  Separately billable procedures and treating other patients   Critical care was necessary to treat or prevent imminent or life-threatening deterioration of the following conditions:  severe angioedema,essential HTN, alcoholism, drug abuse, aspiration pneumonia   Critical care was time spent personally by me on the following activities:  Development of treatment plan with patient or surrogate, discussions with consultants, evaluation of patient's response to treatment, examination of patient, obtaining history from patient or surrogate, ordering and performing treatments and interventions, ordering and review of laboratory studies and re-evaluation of patient's condition.  I assumed direction of critical care for this patient from another provider in my specialty: no    This document was prepared using Dragon voice recognition software and may include unintentional dictation errors.    Ottie Glazier, M.D.  Division of [place]

## 2019-10-10 NOTE — Anesthesia Postprocedure Evaluation (Signed)
Anesthesia Post Note  Patient: Michael Ingram  Procedure(s) Performed: INTUBATION-ENDOTRACHEAL WITH TRACHEOSTOMY STANDBY (N/A Mouth)  Patient location during evaluation: SICU Anesthesia Type: General Level of consciousness: sedated Pain management: pain level controlled Vital Signs Assessment: post-procedure vital signs reviewed and stable Respiratory status: patient remains intubated per anesthesia plan Cardiovascular status: stable Postop Assessment: no apparent nausea or vomiting Anesthetic complications: no     Last Vitals:  Vitals:   10/10/19 0318 10/10/19 0400  BP:    Pulse:    Resp:    Temp:  36.9 C  SpO2: 98%     Last Pain:  Vitals:   10/10/19 0400  TempSrc: Oral  PainSc:                  Elmarie Mainland

## 2019-10-11 LAB — CBC
HCT: 35.2 % — ABNORMAL LOW (ref 39.0–52.0)
Hemoglobin: 11.6 g/dL — ABNORMAL LOW (ref 13.0–17.0)
MCH: 31.1 pg (ref 26.0–34.0)
MCHC: 33 g/dL (ref 30.0–36.0)
MCV: 94.4 fL (ref 80.0–100.0)
Platelets: 158 10*3/uL (ref 150–400)
RBC: 3.73 MIL/uL — ABNORMAL LOW (ref 4.22–5.81)
RDW: 15.4 % (ref 11.5–15.5)
WBC: 13 10*3/uL — ABNORMAL HIGH (ref 4.0–10.5)
nRBC: 0 % (ref 0.0–0.2)

## 2019-10-11 LAB — BASIC METABOLIC PANEL
Anion gap: 4 — ABNORMAL LOW (ref 5–15)
BUN: 15 mg/dL (ref 6–20)
CO2: 27 mmol/L (ref 22–32)
Calcium: 8.9 mg/dL (ref 8.9–10.3)
Chloride: 105 mmol/L (ref 98–111)
Creatinine, Ser: 0.74 mg/dL (ref 0.61–1.24)
GFR calc Af Amer: >60 mL/min (ref >60)
GFR calc non Af Amer: >60 mL/min (ref >60)
Glucose, Bld: 140 mg/dL — ABNORMAL HIGH (ref 70–99)
Potassium: 5 mmol/L (ref 3.5–5.1)
Sodium: 136 mmol/L (ref 135–145)

## 2019-10-11 LAB — TRIGLYCERIDES: Triglycerides: 193 mg/dL — ABNORMAL HIGH (ref <150)

## 2019-10-11 MED ORDER — HYDRALAZINE HCL 20 MG/ML IJ SOLN
10.0000 mg | Freq: Four times a day (QID) | INTRAMUSCULAR | Status: DC | PRN
  Administered 2019-10-12 – 2019-10-14 (×4): 10 mg via INTRAVENOUS
  Filled 2019-10-11 (×5): qty 1

## 2019-10-11 NOTE — Progress Notes (Signed)
PHARMACY CONSULT NOTE  Pharmacy Consult for Electrolyte Monitoring and Replacement   Recent Labs: Potassium (mmol/L)  Date Value  10/11/2019 5.0   Calcium (mg/dL)  Date Value  09/40/7680 8.9   Albumin (g/dL)  Date Value  88/07/314 3.6   Sodium (mmol/L)  Date Value  10/11/2019 136     Assessment: 49 year old male intubated following angioedema, expected to be related to recent ACE inhibitor start. Current plan is to extubate 1/21.  Goal of Therapy:  Electrolytes WNL  Plan:  Potassium improved. Will plan to monitor electrolytes for at least one more day.  Pricilla Riffle ,PharmD Clinical Pharmacist 10/11/2019 3:40 PM

## 2019-10-11 NOTE — Progress Notes (Signed)
10/11/2019 5:01 PM  Michael Ingram [id number]  Post-Op Day 2    Temp:  [98 F (36.7 C)-98.9 F (37.2 C)] 98.4 F (36.9 C) (01/21 1652) Pulse Rate:  [61-80] 67 (01/21 1652) Resp:  [14-16] 15 (01/21 1652) BP: (106-145)/(62-90) 144/87 (01/21 1600) SpO2:  [98 %-100 %] 98 % (01/21 1652) FiO2 (%):  [24 %-35 %] 24 % (01/21 1501),     Intake/Output Summary (Last 24 hours) at 10/11/2019 1701 Last data filed at 10/11/2019 1203 Gross per 24 hour  Intake 1873.99 ml  Output 3550 ml  Net -1676.01 ml    Results for orders placed or performed during the hospital encounter of 10/09/19 (from the past 24 hour(s))  Triglycerides     Status: Abnormal   Collection Time: 10/11/19  4:21 AM  Result Value Ref Range   Triglycerides 193 (H) <150 mg/dL  Basic metabolic panel     Status: Abnormal   Collection Time: 10/11/19  4:21 AM  Result Value Ref Range   Sodium 136 135 - 145 mmol/L   Potassium 5.0 3.5 - 5.1 mmol/L   Chloride 105 98 - 111 mmol/L   CO2 27 22 - 32 mmol/L   Glucose, Bld 140 (H) 70 - 99 mg/dL   BUN 15 6 - 20 mg/dL   Creatinine, Ser 0.01 0.61 - 1.24 mg/dL   Calcium 8.9 8.9 - 74.9 mg/dL   GFR calc non Af Amer >60 >60 mL/min   GFR calc Af Amer >60 >60 mL/min   Anion gap 4 (L) 5 - 15  CBC     Status: Abnormal   Collection Time: 10/11/19  4:21 AM  Result Value Ref Range   WBC 13.0 (H) 4.0 - 10.5 K/uL   RBC 3.73 (L) 4.22 - 5.81 MIL/uL   Hemoglobin 11.6 (L) 13.0 - 17.0 g/dL   HCT 44.9 (L) 67.5 - 91.6 %   MCV 94.4 80.0 - 100.0 fL   MCH 31.1 26.0 - 34.0 pg   MCHC 33.0 30.0 - 36.0 g/dL   RDW 38.4 66.5 - 99.3 %   Platelets 158 150 - 400 K/uL   nRBC 0.0 0.0 - 0.2 %    SUBJECTIVE:  Intubated and sedated  OBJECTIVE:  Lips and tongue more swollen today  IMPRESSION:  Angioedema-appears to be progressing from previous exam  PLAN:  We had discussed extubating earlier today, but I agree with Dr. Karna Christmas, the edema appears to be worse, and with new onset pneumonia, we will likely  need to hold off on extubation.  I will not be available tomorrow through Monday, but my partner Dr. Andee Poles knows the case in my absence.    [name] 10/11/2019, 5:01 PM

## 2019-10-11 NOTE — Progress Notes (Signed)
CRITICAL CARE NOTE    Name: Michael Ingram MRN: [id number] DOB: 07/03/1971     LOS: 2   SUBJECTIVE FINDINGS & SIGNIFICANT EVENTS   Patient description:   49 yo M came in to ED due to sensation of dysphagia post prandially.  He reported eating spicy food.  While in ED he reported sensation of neck swelling, also noted digital edema newly formed. Patient started lisinopril today for his BP which has been uncontrolled. He is an active user of high volume EtOH, per wife patient "drinks a firth per night". Patient also uses THC. While in ED it was noted he had upper airway edema and ENT was consulted who also discussed with anesthesia and it was decided to secure airway via ETT.   Lines / Drains: PIVx2  Cultures / Sepsis markers: n/a  Antibiotics: none   Protocols / Consultants: ENT  Tests / Events: none  Overnight: Post intubation   PAST MEDICAL HISTORY   Past Medical History:  Diagnosis Date  . Hypertension      SURGICAL HISTORY   Past Surgical History:  Procedure Laterality Date  . INTUBATION-ENDOTRACHEAL WITH TRACHEOSTOMY STANDBY N/A 10/09/2019   Procedure: INTUBATION-ENDOTRACHEAL WITH TRACHEOSTOMY STANDBY;  Surgeon: Beverly Gust, MD;  Location: [facility] ORS;  Service: ENT;  Laterality: N/A;     FAMILY HISTORY   History reviewed. No pertinent family history.   SOCIAL HISTORY   Social History   Tobacco Use  . Smoking status: Current Every Day Smoker    Packs/day: 0.50    Types: Cigarettes  . Smokeless tobacco: Never Used  Substance Use Topics  . Alcohol use: Yes  . Drug use: Yes    Types: Marijuana     MEDICATIONS   Current Medication:  Current Facility-Administered Medications:  .  0.9 %  sodium chloride infusion, , Intravenous, Continuous, Whiteheart, Kathryn A, NP,  Stopped at 10/10/19 1331 .  albuterol (PROVENTIL) (2.5 MG/3ML) 0.083% nebulizer solution 2.5 mg, 2.5 mg, Nebulization, Q2H PRN, Whiteheart, Cristal Ford, NP .  Ampicillin-Sulbactam (UNASYN) 3 g in sodium chloride 0.9 % 100 mL IVPB, 3 g, Intravenous, Q6H, Laiylah Roettger, MD, Last Rate: 200 mL/hr at 10/11/19 1655, 3 g at 10/11/19 1655 .  chlorhexidine gluconate (MEDLINE KIT) (PERIDEX) 0.12 % solution 15 mL, 15 mL, Mouth Rinse, BID, Lanney Gins, Dayra Rapley, MD, 15 mL at 10/11/19 0759 .  Chlorhexidine Gluconate Cloth 2 % PADS 6 each, 6 each, Topical, Daily, Beverly Gust, MD, 6 each at 10/11/19 [phone]  dexamethasone (DECADRON) injection 10 mg, 10 mg, Intravenous, Q12H, Lanney Gins, Navraj Dreibelbis, MD, 10 mg at 10/11/19 1002 .  dexmedetomidine (PRECEDEX) 400 MCG/100ML (4 mcg/mL) infusion, 0.4-1.2 mcg/kg/hr, Intravenous, Titrated, Sergi Gellner, MD, Last Rate: 22.1 mL/hr at 10/11/19 1652, 1.2 mcg/kg/hr at 10/11/19 1652 .  diphenhydrAMINE (BENADRYL) injection 50 mg, 50 mg, Intravenous, TID, Ottie Glazier, MD, 50 mg at 10/11/19 1618 .  famotidine (PEPCID) IVPB 20 mg premix, 20 mg, Intravenous, Q24H, Whiteheart, Cristal Ford, NP, Stopped at 10/10/19 2303 .  fentaNYL (SUBLIMAZE) injection 50 mcg, 50 mcg, Intravenous, Q15 min PRN, Whiteheart, Kathryn A, NP .  fentaNYL (SUBLIMAZE) injection 50-200 mcg, 50-200 mcg, Intravenous, Q30 min PRN, Whiteheart, Kathryn A, NP, 200 mcg at 10/09/19 2139 .  fentaNYL 2523mg in NS 2579m(103mml) infusion-PREMIX, 0-400 mcg/hr, Intravenous, Continuous, Ogan, Okoronkwo U, MD, Last Rate: 40 mL/hr at 10/11/19 1823, 400 mcg/hr at 10/11/19 1823 .  folic acid (FOLVITE) tablet 1 mg, 1 mg, Oral, Daily, AleOttie GlazierD .  MEDLINE mouth rinse,  15 mL, Mouth Rinse, 10 times per day, Ottie Glazier, MD, 15 mL at 10/11/19 1823 .  midazolam (VERSED) 50 mg/50 mL (1 mg/mL) premix infusion, 0.5 mg/hr, Intravenous, Continuous, Jazara Swiney, MD, Last Rate: 4 mL/hr at 10/11/19 1347, 4 mg/hr at 10/11/19 1347  .  midazolam (VERSED) injection 2 mg, 2 mg, Intravenous, Q2H PRN, Ottie Glazier, MD, 2 mg at 10/10/19 1032 .  thiamine (B-1) injection 100 mg, 100 mg, Intravenous, Daily, Lanney Gins, Ojani Berenson, MD, 100 mg at 10/11/19 0945    ALLERGIES   Lisinopril    REVIEW OF SYSTEMS     Unable to obtain patient is sedated on MV  PHYSICAL EXAMINATION   Vital Signs: Temp:  [98 F (36.7 C)-98.9 F (37.2 C)] 98.4 F (36.9 C) (01/21 1652) Pulse Rate:  [61-80] 65 (01/21 1800) Resp:  [14-16] 15 (01/21 1800) BP: (106-153)/(62-91) 153/91 (01/21 1800) SpO2:  [98 %-100 %] 98 % (01/21 1800) FiO2 (%):  [24 %-35 %] 24 % (01/21 1501)  GENERAL: Lightly sedated and intubated to RASS-0 HEAD: Normocephalic, atraumatic.  EYES: Pupils equal, round, reactive to light.  No scleral icterus.  MOUTH: Moist mucosal membrane. NECK: Supple. No thyromegaly. No nodules. No JVD.  PULMONARY: CTAB CARDIOVASCULAR: S1 and S2. Regular rate and rhythm. No murmurs, rubs, or gallops.  GASTROINTESTINAL: Soft, nontender, non-distended. No masses. Positive bowel sounds. No hepatosplenomegaly.  MUSCULOSKELETAL: No swelling, clubbing, or edema.  NEUROLOGIC: Mild distress due to acute illness SKIN:intact,warm,dry   PERTINENT DATA     Infusions: . sodium chloride Stopped (10/10/19 1331)  . ampicillin-sulbactam (UNASYN) IV 3 g (10/11/19 1655)  . dexmedetomidine (PRECEDEX) IV infusion 1.2 mcg/kg/hr (10/11/19 1652)  . famotidine (PEPCID) IV Stopped (10/10/19 2303)  . fentaNYL infusion INTRAVENOUS 400 mcg/hr (10/11/19 1823)  . midazolam 4 mg/hr (10/11/19 1347)   Scheduled Medications: . chlorhexidine gluconate (MEDLINE KIT)  15 mL Mouth Rinse BID  . Chlorhexidine Gluconate Cloth  6 each Topical Daily  . dexamethasone (DECADRON) injection  10 mg Intravenous Q12H  . diphenhydrAMINE  50 mg Intravenous TID  . folic acid  1 mg Oral Daily  . mouth rinse  15 mL Mouth Rinse 10 times per day  . thiamine injection  100 mg  Intravenous Daily   PRN Medications: albuterol, fentaNYL (SUBLIMAZE) injection, fentaNYL (SUBLIMAZE) injection, midazolam Hemodynamic parameters:   Intake/Output: 01/20 0701 - 01/21 0700 In: 2967.5 [I.V.:2467.5; IV QQPYPPJKD:326] Out: 7124 [PYKDX:8338]  Ventilator  Settings: Vent Mode: PRVC FiO2 (%):  [24 %-35 %] 24 % Set Rate:  [15 bmp] 15 bmp Vt Set:  [500 mL] 500 mL PEEP:  [5 cmH20] 5 cmH20 Plateau Pressure:  [16 cmH20-17 cmH20] 16 cmH20    LAB RESULTS:  Basic Metabolic Panel: Recent Labs  Lab 10/09/19 1809 10/09/19 1809 10/10/19 0515 10/10/19 0515 10/10/19 0941 10/11/19 0421  NA 135  --  132*  --   --  136  K 3.6   < > 6.1*   < > 5.3* 5.0  CL 98  --  101  --   --  105  CO2 24  --  27  --   --  27  GLUCOSE 111*  --  164*  --   --  140*  BUN 14  --  18  --   --  15  CREATININE 0.76  --  1.17  --   --  0.74  CALCIUM 9.6  --  8.9  --   --  8.9   < > = values in  this interval not displayed.   Liver Function Tests: Recent Labs  Lab 10/10/19 0515  AST 31  ALT 33  ALKPHOS 55  BILITOT 0.5  PROT 6.7  ALBUMIN 3.6   No results for input(s): LIPASE, AMYLASE in the last 168 hours. No results for input(s): AMMONIA in the last 168 hours. CBC: Recent Labs  Lab 10/09/19 1809 10/10/19 0515 10/11/19 0421  WBC 6.2 6.4 13.0*  HGB 13.6 12.8* 11.6*  HCT 39.5 37.4* 35.2*  MCV 89.0 90.6 94.4  PLT 162 172 158   Cardiac Enzymes: No results for input(s): CKTOTAL, CKMB, CKMBINDEX, TROPONINI in the last 168 hours. BNP: Invalid input(s): POCBNP CBG: Recent Labs  Lab 10/09/19 1951  GLUCAP 145*     IMAGING RESULTS:  Imaging: DG Chest Port 1 View  Result Date: 10/09/2019 CLINICAL DATA:  49 year old male status post intubation. EXAM: PORTABLE CHEST 1 VIEW COMPARISON:  None. FINDINGS: Endotracheal tube with tip approximately 6.8 cm above the carina. Patchy right lung base density may represent atelectasis or infiltrate. Clinical correlation is recommended. There is  no pleural effusion or pneumothorax. The cardiac silhouette is within normal is. No acute osseous pathology. IMPRESSION: 1. Endotracheal tube above the carina. 2. Right lung base atelectasis versus infiltrate. Electronically Signed   By: Anner Crete M.D.   On: 10/09/2019 21:15      ASSESSMENT AND PLAN    -Multidisciplinary rounds held today  Aspiration pneumonia  - Unasyn IV -respiratory culutre -bronchopulmonary hygiene  Severe Angioedema with upper airway edema - Famotidine -Dexamethasone 10 BID  - Diphenhyhdramine 50 IV TID -Wean Fio2 and PEEP as tolerated -will perform SAT/SBT when respiratory parameters are me ICU monitoring\ -discussed with ENT - Dr Tami Ribas - appreciate input   Alcohol and [place] abuse  - ICU telemetry monitoring  - CIWA protocol  -follow chem 7- repelete electrolytes    ID -continue IV abx as prescibed -follow up cultures  GI/Nutrition GI PROPHYLAXIS as indicated DIET-->TF's as tolerated Constipation protocol as indicated  ENDO - ICU hypoglycemic\Hyperglycemia protocol -check FSBS per protocol   ELECTROLYTES -follow labs as needed -replace as needed -pharmacy consultation   DVT/GI PRX ordered -SCDs  TRANSFUSIONS AS NEEDED MONITOR FSBS ASSESS the need for LABS as needed   Critical care provider statement:    Critical care time (minutes):  33   Critical care time was exclusive of:  Separately billable procedures and treating other patients   Critical care was necessary to treat or prevent imminent or life-threatening deterioration of the following conditions:  severe angioedema,essential HTN, alcoholism, drug abuse, aspiration pneumonia   Critical care was time spent personally by me on the following activities:  Development of treatment plan with patient or surrogate, discussions with consultants, evaluation of patient's response to treatment, examination of patient, obtaining history from patient or surrogate, ordering and  performing treatments and interventions, ordering and review of laboratory studies and re-evaluation of patient's condition.  I assumed direction of critical care for this patient from another provider in my specialty: no    This document was prepared using Dragon voice recognition software and may include unintentional dictation errors.    Ottie Glazier, M.D.  Division of [place]

## 2019-10-12 ENCOUNTER — Inpatient Hospital Stay: Payer: Self-pay

## 2019-10-12 LAB — BASIC METABOLIC PANEL
Anion gap: 10 (ref 5–15)
BUN: 18 mg/dL (ref 6–20)
CO2: 28 mmol/L (ref 22–32)
Calcium: 9.1 mg/dL (ref 8.9–10.3)
Chloride: 101 mmol/L (ref 98–111)
Creatinine, Ser: 0.67 mg/dL (ref 0.61–1.24)
GFR calc Af Amer: >60 mL/min (ref >60)
GFR calc non Af Amer: >60 mL/min (ref >60)
Glucose, Bld: 136 mg/dL — ABNORMAL HIGH (ref 70–99)
Potassium: 4.3 mmol/L (ref 3.5–5.1)
Sodium: 139 mmol/L (ref 135–145)

## 2019-10-12 LAB — CBC
HCT: 36 % — ABNORMAL LOW (ref 39.0–52.0)
Hemoglobin: 12 g/dL — ABNORMAL LOW (ref 13.0–17.0)
MCH: 31.4 pg (ref 26.0–34.0)
MCHC: 33.3 g/dL (ref 30.0–36.0)
MCV: 94.2 fL (ref 80.0–100.0)
Platelets: 167 10*3/uL (ref 150–400)
RBC: 3.82 MIL/uL — ABNORMAL LOW (ref 4.22–5.81)
RDW: 14.9 % (ref 11.5–15.5)
WBC: 9.3 10*3/uL (ref 4.0–10.5)
nRBC: 0 % (ref 0.0–0.2)

## 2019-10-12 LAB — TRIGLYCERIDES: Triglycerides: 198 mg/dL — ABNORMAL HIGH (ref <150)

## 2019-10-12 MED ORDER — FOLIC ACID 5 MG/ML IJ SOLN
1.0000 mg | Freq: Every day | INTRAMUSCULAR | Status: DC
  Administered 2019-10-12 – 2019-10-13 (×2): 1 mg via INTRAVENOUS
  Filled 2019-10-12 (×3): qty 0.2

## 2019-10-12 MED ORDER — HYDRALAZINE HCL 20 MG/ML IJ SOLN
10.0000 mg | Freq: Once | INTRAMUSCULAR | Status: AC
  Administered 2019-10-12: 10 mg via INTRAVENOUS

## 2019-10-12 MED ORDER — DEXAMETHASONE SODIUM PHOSPHATE 10 MG/ML IJ SOLN
10.0000 mg | Freq: Three times a day (TID) | INTRAMUSCULAR | Status: DC
  Administered 2019-10-12 – 2019-10-14 (×6): 10 mg via INTRAVENOUS
  Filled 2019-10-12 (×8): qty 1

## 2019-10-12 MED ORDER — ENOXAPARIN SODIUM 40 MG/0.4ML ~~LOC~~ SOLN
40.0000 mg | SUBCUTANEOUS | Status: DC
  Administered 2019-10-12 – 2019-10-14 (×3): 40 mg via SUBCUTANEOUS
  Filled 2019-10-12 (×3): qty 0.4

## 2019-10-12 NOTE — Progress Notes (Signed)
[facility] encountered wife Lynnea Ferrier) outside ICU waiting room.  Wife shared pt. came to hospital after an allergic reaction that caused difficulty breathing. Wife is confident in treatment provided by medical team, but 'worried' about pt.'s condition; she shared some sense of anxiety and stress in being primary visitor and source of info for family, as well as bearing the difficulty of seeing her husband in this state.  Wife shared a sense of gratitude for [facility] presence on unit; reported being 'spiritual' but not religious.  Wife may benefit from follow-up if the opportunity presents itself; she is aware of [facility] availability.  No further needs expressed at this time.        10/12/19 1300  Clinical Encounter Type  Visited With Family  Visit Type Initial;Critical Care  Spiritual Encounters  Spiritual Needs Emotional  Stress Factors  Family Stress Factors Family relationships;Exhausted;Health changes;Lack of knowledge;Loss of control;Major life changes

## 2019-10-12 NOTE — Progress Notes (Signed)
CRITICAL CARE NOTE    Name: Michael Ingram MRN: [id number] DOB: [birth year]     LOS: 3   SUBJECTIVE FINDINGS & SIGNIFICANT EVENTS   Patient description:   49 yo M came in to ED due to sensation of dysphagia post prandially.  He reported eating spicy food.  While in ED he reported sensation of neck swelling, also noted digital edema newly formed. Patient started lisinopril today for his BP which has been uncontrolled. He is an active user of high volume EtOH, per wife patient "drinks a firth per night". Patient also uses THC. While in ED it was noted he had upper airway edema and ENT was consulted who also discussed with anesthesia and it was decided to secure airway via ETT.   Lines / Drains: PIVx2  Cultures / Sepsis markers: n/a  Antibiotics: none   Protocols / Consultants: ENT  Tests / Events: none  Overnight: Post intubation   10/12/19 - patient is mildly improved, lingual swelling is still significant but improved, lip swelling is also improved, cuff leak is present, hand edema is improved.   PAST MEDICAL HISTORY   Past Medical History:  Diagnosis Date  . Hypertension      SURGICAL HISTORY   Past Surgical History:  Procedure Laterality Date  . INTUBATION-ENDOTRACHEAL WITH TRACHEOSTOMY STANDBY N/A 10/09/2019   Procedure: INTUBATION-ENDOTRACHEAL WITH TRACHEOSTOMY STANDBY;  Surgeon: Beverly Gust, MD;  Location: [facility] ORS;  Service: ENT;  Laterality: N/A;     FAMILY HISTORY   History reviewed. No pertinent family history.   SOCIAL HISTORY   Social History   Tobacco Use  . Smoking status: Current Every Day Smoker    Packs/day: 0.50    Types: Cigarettes  . Smokeless tobacco: Never Used  Substance Use Topics  . Alcohol use: Yes  . Drug use: Yes    Types: Marijuana      MEDICATIONS   Current Medication:  Current Facility-Administered Medications:  .  0.9 %  sodium chloride infusion, , Intravenous, Continuous, Whiteheart, Kathryn A, NP, Stopped at 10/10/19 1331 .  albuterol (PROVENTIL) (2.5 MG/3ML) 0.083% nebulizer solution 2.5 mg, 2.5 mg, Nebulization, Q2H PRN, Whiteheart, Cristal Ford, NP .  Ampicillin-Sulbactam (UNASYN) 3 g in sodium chloride 0.9 % 100 mL IVPB, 3 g, Intravenous, Q6H, Sherrell Weir, MD, Last Rate: 200 mL/hr at 10/12/19 1624, 3 g at 10/12/19 1624 .  chlorhexidine gluconate (MEDLINE KIT) (PERIDEX) 0.12 % solution 15 mL, 15 mL, Mouth Rinse, BID, Lanney Gins, Oriel Rumbold, MD, 15 mL at 10/12/19 0918 .  Chlorhexidine Gluconate Cloth 2 % PADS 6 each, 6 each, Topical, Daily, Beverly Gust, MD, 6 each at 10/12/19 [id number] .  dexamethasone (DECADRON) injection 10 mg, 10 mg, Intravenous, Q8H, Rashell Shambaugh, MD, 10 mg at 10/12/19 1615 .  dexmedetomidine (PRECEDEX) 400 MCG/100ML (4 mcg/mL) infusion, 0.4-1.2 mcg/kg/hr, Intravenous, Titrated, Naly Schwanz, MD, Last Rate: 22.1 mL/hr at 10/12/19 1632, 1.2 mcg/kg/hr at 10/12/19 1632 .  diphenhydrAMINE (BENADRYL) injection 50 mg, 50 mg, Intravenous, TID, Lanney Gins, Kaladin Noseworthy, MD, 50 mg at 10/12/19 1615 .  enoxaparin (LOVENOX) injection 40 mg, 40 mg, Subcutaneous, Q24H, Lanney Gins, Deriona Altemose, MD, 40 mg at 10/12/19 1616 .  famotidine (PEPCID) IVPB 20 mg premix, 20 mg, Intravenous, Q24H, Whiteheart, Cristal Ford, NP, Stopped at 10/11/19 2244 .  fentaNYL (SUBLIMAZE) injection 50 mcg, 50 mcg, Intravenous, Q15 min PRN, Whiteheart, Kathryn A, NP .  fentaNYL (SUBLIMAZE) injection 50-200 mcg, 50-200 mcg, Intravenous, Q30 min PRN, Whiteheart, Kathryn A, NP, 200 mcg at 10/09/19 2139 .  fentaNYL 2587mg in NS 2551m(1027mml) infusion-PREMIX, 0-400 mcg/hr, Intravenous, Continuous, Ogan, Okoronkwo U, MD, Last Rate: 40 mL/hr at 10/12/19 1249, 400 mcg/hr at 10/12/19 1249 .  folic acid injection 1 mg, 1 mg, Intravenous, Daily, AleLanney GinsFuad, MD, 1 mg at 10/12/19 1335 .  hydrALAZINE (APRESOLINE) injection 10 mg, 10 mg, Intravenous, Q6H PRN, KeeDarel Hong NP, 10 mg at 10/12/19 0039 .  MEDLINE mouth rinse, 15 mL, Mouth Rinse, 10 times per day, AleOttie GlazierD, 15 mL at 10/12/19 1616 .  midazolam (VERSED) 50 mg/50 mL (1 mg/mL) premix infusion, 0.5 mg/hr, Intravenous, Continuous, Corliss Lamartina, MD, Last Rate: 4 mL/hr at 10/12/19 1454, 4 mg/hr at 10/12/19 1454 .  midazolam (VERSED) injection 2 mg, 2 mg, Intravenous, Q2H PRN, AleOttie GlazierD, 2 mg at 10/10/19 1032 .  thiamine (B-1) injection 100 mg, 100 mg, Intravenous, Daily, AleLanney Ginsuad, MD, 100 mg at 10/12/19 0931    ALLERGIES   Lisinopril    REVIEW OF SYSTEMS     Unable to obtain patient is sedated on MV  PHYSICAL EXAMINATION   Vital Signs: Temp:  [98.8 F (37.1 C)-100.3 F (37.9 C)] 99.3 F (37.4 C) (01/22 1600) Pulse Rate:  [64-162] 92 (01/22 1600) Resp:  [0-18] 18 (01/22 1600) BP: (139-206)/(80-128) 166/91 (01/22 1600) SpO2:  [93 %-100 %] 97 % (01/22 1600) FiO2 (%):  [24 %] 24 % (01/22 1520)  GENERAL: Lightly sedated and intubated to RASS-0 HEAD: Normocephalic, atraumatic.  EYES: Pupils equal, round, reactive to light.  No scleral icterus.  MOUTH: Moist mucosal membrane. NECK: Supple. No thyromegaly. No nodules. No JVD.  PULMONARY: CTAB CARDIOVASCULAR: S1 and S2. Regular rate and rhythm. No murmurs, rubs, or gallops.  GASTROINTESTINAL: Soft, nontender, non-distended. No masses. Positive bowel sounds. No hepatosplenomegaly.  MUSCULOSKELETAL: No swelling, clubbing, or edema.  NEUROLOGIC: Mild distress due to acute illness SKIN:intact,warm,dry   PERTINENT DATA     Infusions: . sodium chloride Stopped (10/10/19 1331)  . ampicillin-sulbactam (UNASYN) IV 3 g (10/12/19 1624)  . dexmedetomidine (PRECEDEX) IV infusion 1.2 mcg/kg/hr (10/12/19 1632)  . famotidine (PEPCID) IV Stopped (10/11/19 2244)  . fentaNYL infusion INTRAVENOUS 400  mcg/hr (10/12/19 1249)  . midazolam 4 mg/hr (10/12/19 1454)   Scheduled Medications: . chlorhexidine gluconate (MEDLINE KIT)  15 mL Mouth Rinse BID  . Chlorhexidine Gluconate Cloth  6 each Topical Daily  . dexamethasone (DECADRON) injection  10 mg Intravenous Q8H  . diphenhydrAMINE  50 mg Intravenous TID  . enoxaparin (LOVENOX) injection  40 mg Subcutaneous Q24H  . folic acid  1 mg Intravenous Daily  . mouth rinse  15 mL Mouth Rinse 10 times per day  . thiamine injection  100 mg Intravenous Daily   PRN Medications: albuterol, fentaNYL (SUBLIMAZE) injection, fentaNYL (SUBLIMAZE) injection, hydrALAZINE, midazolam Hemodynamic parameters:   Intake/Output: 01/21 0701 - 01/22 0700 In: 1577.2 [I.V.:1127.2; IV Piggyback:450] Out: 2320 [Urine:2320]  Ventilator  Settings: Vent Mode: PRVC FiO2 (%):  [24 %] 24 % Set Rate:  [15 bmp] 15 bmp Vt Set:  [500 mL] 500 mL PEEP:  [5 cmH20] 5 cmH20 Plateau Pressure:  [16 cmH20-17 cmH20] 17 cmH20    LAB RESULTS:  Basic Metabolic Panel: Recent Labs  Lab 10/09/19 1809 10/09/19 1809 10/10/19 0515 10/10/19 0941 10/11/19 0421 10/12/19 0508  NA 135  --  132*  --  136 139  K 3.6   < > 6.1*   < > 5.0 4.3  CL 98  --  101  --  105 101  CO2 24  --  27  --  27 28  GLUCOSE 111*  --  164*  --  140* 136*  BUN 14  --  18  --  15 18  CREATININE 0.76  --  1.17  --  0.74 0.67  CALCIUM 9.6  --  8.9  --  8.9 9.1   < > = values in this interval not displayed.   Liver Function Tests: Recent Labs  Lab 10/10/19 0515  AST 31  ALT 33  ALKPHOS 55  BILITOT 0.5  PROT 6.7  ALBUMIN 3.6   No results for input(s): LIPASE, AMYLASE in the last 168 hours. No results for input(s): AMMONIA in the last 168 hours. CBC: Recent Labs  Lab 10/09/19 1809 10/10/19 0515 10/11/19 0421 10/12/19 0508  WBC 6.2 6.4 13.0* 9.3  HGB 13.6 12.8* 11.6* 12.0*  HCT 39.5 37.4* 35.2* 36.0*  MCV 89.0 90.6 94.4 94.2  PLT 162 172 158 167   Cardiac Enzymes: No results for  input(s): CKTOTAL, CKMB, CKMBINDEX, TROPONINI in the last 168 hours. BNP: Invalid input(s): POCBNP CBG: Recent Labs  Lab 10/09/19 1951  GLUCAP 145*     IMAGING RESULTS:  Imaging: DG Chest Port 1 View  Result Date: 10/12/2019 CLINICAL DATA:  Acute respiratory failure. EXAM: PORTABLE CHEST 1 VIEW COMPARISON:  10/09/2019. FINDINGS: Endotracheal tube tip in stable position 6.8 cm above the carina. Heart size stable. No pulmonary venous congestion. Persistent unchanged right base infiltrate. No pleural effusion or pneumothorax. No acute bony abnormality. IMPRESSION: 1. Endotracheal tube tip in stable position 6.8 cm above the carina. 2. Persistent unchanged right base infiltrate consistent with pneumonia. Electronically Signed   By: Marcello Moores  Register   On: 10/12/2019 05:33      ASSESSMENT AND PLAN    -Multidisciplinary rounds held today  Aspiration pneumonia  - Unasyn IV -respiratory culutre -bronchopulmonary hygiene -MRSA pcr negative COVID and flu negative    Severe Angioedema with upper airway edema - Famotidine -Dexamethasone 10 BID  - Diphenhyhdramine 50 IV TID -Wean Fio2 and PEEP as tolerated -will perform SAT/SBT when respiratory parameters are me ICU monitoring\ -discussed with ENT - Dr Tami Ribas - appreciate input   Alcohol and [place] abuse  - ICU telemetry monitoring  - CIWA protocol  -follow chem 7- repelete electrolytes    ID -continue IV abx as prescibed -follow up cultures  GI/Nutrition GI PROPHYLAXIS as indicated DIET-->TF's as tolerated Constipation protocol as indicated  ENDO - ICU hypoglycemic\Hyperglycemia protocol -check FSBS per protocol   ELECTROLYTES -follow labs as needed -replace as needed -pharmacy consultation   DVT/GI PRX ordered -SCDs  TRANSFUSIONS AS NEEDED MONITOR FSBS ASSESS the need for LABS as needed   Critical care provider statement:    Critical care time (minutes):  33   Critical care time was exclusive of:   Separately billable procedures and treating other patients   Critical care was necessary to treat or prevent imminent or life-threatening deterioration of the following conditions:  severe angioedema,essential HTN, alcoholism, drug abuse, aspiration pneumonia   Critical care was time spent personally by me on the following activities:  Development of treatment plan with patient or surrogate, discussions with consultants, evaluation of patient's response to treatment, examination of patient, obtaining history from patient or surrogate, ordering and performing treatments and interventions, ordering and review of laboratory studies and re-evaluation of patient's condition.  I assumed direction of critical care for this patient from another provider in my specialty: no    This document  was prepared using Systems analyst and may include unintentional dictation errors.    Ottie Glazier, M.D.  Division of [place]

## 2019-10-12 NOTE — Progress Notes (Signed)
PHARMACY CONSULT NOTE  Pharmacy Consult for Electrolyte Monitoring and Replacement   Recent Labs: Potassium (mmol/L)  Date Value  10/12/2019 4.3   Calcium (mg/dL)  Date Value  58/02/3867 9.1   Albumin (g/dL)  Date Value  54/88/3014 3.6   Sodium (mmol/L)  Date Value  10/12/2019 139     Assessment: 49 year old male intubated following angioedema, expected to be related to recent ACE inhibitor start. Current plan is to extubate 1/21.  Goal of Therapy:  Electrolytes WNL  Plan:  Potassium continues to improve. Will plan to check all electrolytes tomorrow as patient intubated, unable to provide nutrition. If remain stable, will consider monitoring q48h unless otherwise ordered by MD.  Pricilla Riffle ,PharmD Clinical Pharmacist 10/12/2019 12:09 PM

## 2019-10-13 ENCOUNTER — Encounter: Payer: Self-pay | Admitting: Pulmonary Disease

## 2019-10-13 LAB — BASIC METABOLIC PANEL
Anion gap: 9 (ref 5–15)
BUN: 17 mg/dL (ref 6–20)
CO2: 30 mmol/L (ref 22–32)
Calcium: 9 mg/dL (ref 8.9–10.3)
Chloride: 99 mmol/L (ref 98–111)
Creatinine, Ser: 0.55 mg/dL — ABNORMAL LOW (ref 0.61–1.24)
GFR calc Af Amer: >60 mL/min (ref >60)
GFR calc non Af Amer: >60 mL/min (ref >60)
Glucose, Bld: 132 mg/dL — ABNORMAL HIGH (ref 70–99)
Potassium: 4.2 mmol/L (ref 3.5–5.1)
Sodium: 138 mmol/L (ref 135–145)

## 2019-10-13 LAB — TRIGLYCERIDES: Triglycerides: 123 mg/dL (ref <150)

## 2019-10-13 LAB — PHOSPHORUS: Phosphorus: 4.2 mg/dL (ref 2.5–4.6)

## 2019-10-13 LAB — MAGNESIUM: Magnesium: 2 mg/dL (ref 1.7–2.4)

## 2019-10-13 MED ORDER — LORAZEPAM 1 MG PO TABS
1.0000 mg | ORAL_TABLET | ORAL | Status: DC | PRN
  Administered 2019-10-13 – 2019-10-14 (×3): 1 mg via ORAL
  Filled 2019-10-13 (×3): qty 1

## 2019-10-13 MED ORDER — THIAMINE HCL 100 MG/ML IJ SOLN
100.0000 mg | Freq: Every day | INTRAMUSCULAR | Status: DC
  Administered 2019-10-14: 100 mg via INTRAVENOUS
  Filled 2019-10-13 (×3): qty 2

## 2019-10-13 MED ORDER — ONDANSETRON HCL 4 MG/2ML IJ SOLN
INTRAMUSCULAR | Status: AC
  Administered 2019-10-13: 17:00:00 4 mg via INTRAVENOUS
  Filled 2019-10-13: qty 2

## 2019-10-13 MED ORDER — FOLIC ACID 1 MG PO TABS
1.0000 mg | ORAL_TABLET | Freq: Every day | ORAL | Status: DC
  Administered 2019-10-13 – 2019-10-15 (×2): 1 mg via ORAL
  Filled 2019-10-13 (×2): qty 1

## 2019-10-13 MED ORDER — ONDANSETRON HCL 4 MG/2ML IJ SOLN
4.0000 mg | Freq: Four times a day (QID) | INTRAMUSCULAR | Status: DC | PRN
  Administered 2019-10-13: 4 mg via INTRAVENOUS

## 2019-10-13 MED ORDER — THIAMINE HCL 100 MG PO TABS
100.0000 mg | ORAL_TABLET | Freq: Every day | ORAL | Status: DC
  Administered 2019-10-13 – 2019-10-15 (×2): 100 mg via ORAL
  Filled 2019-10-13 (×2): qty 1

## 2019-10-13 MED ORDER — LORAZEPAM 2 MG/ML IJ SOLN
1.0000 mg | INTRAMUSCULAR | Status: DC | PRN
  Administered 2019-10-15: 2 mg via INTRAVENOUS
  Filled 2019-10-13 (×2): qty 1

## 2019-10-13 MED ORDER — ADULT MULTIVITAMIN W/MINERALS CH
1.0000 | ORAL_TABLET | Freq: Every day | ORAL | Status: DC
  Administered 2019-10-13 – 2019-10-15 (×2): 1 via ORAL
  Filled 2019-10-13 (×2): qty 1

## 2019-10-13 MED ORDER — CLONIDINE HCL 0.1 MG PO TABS
0.1000 mg | ORAL_TABLET | Freq: Three times a day (TID) | ORAL | Status: DC
  Administered 2019-10-13 – 2019-10-14 (×4): 0.1 mg via ORAL
  Filled 2019-10-13 (×4): qty 1

## 2019-10-13 NOTE — Progress Notes (Addendum)
PHARMACY CONSULT NOTE  Pharmacy Consult for Electrolyte Monitoring and Replacement   Recent Labs: Potassium (mmol/L)  Date Value  10/13/2019 4.2   Magnesium (mg/dL)  Date Value  64/38/3779 2.0   Calcium (mg/dL)  Date Value  39/68/8648 9.0   Albumin (g/dL)  Date Value  47/20/7218 3.6   Phosphorus (mg/dL)  Date Value  28/83/3744 4.2   Sodium (mmol/L)  Date Value  10/13/2019 138     Assessment: 49 year old male intubated following angioedema, expected to be related to recent ACE inhibitor start. Current plan is to extubate 1/21.  Goal of Therapy:  Electrolytes WNL  Plan:  Potassium continues to improve. Will plan to check all electrolytes q48H as patient intubated, unable to provide nutrition.   Ronnald Ramp ,PharmD, BCPS Clinical Pharmacist 10/13/2019 7:23 AM

## 2019-10-13 NOTE — Progress Notes (Signed)
Annabelle Harman, NP to place CIWA order set

## 2019-10-13 NOTE — Progress Notes (Signed)
Patient was extubated by RT with RN and wife at bedside.  Placed him on 2 lpm O2 via [place].

## 2019-10-13 NOTE — Progress Notes (Signed)
CRITICAL CARE NOTE    Name: Michael Ingram MRN: [id number] DOB: 03-17-1971     LOS: 4   SUBJECTIVE FINDINGS & SIGNIFICANT EVENTS   Patient description:   49 yo M came in to ED due to sensation of dysphagia post prandially.  He reported eating spicy food.  While in ED he reported sensation of neck swelling, also noted digital edema newly formed. Patient started lisinopril today for his BP which has been uncontrolled. He is an active user of high volume EtOH, per wife patient "drinks a firth per night". Patient also uses THC. While in ED it was noted he had upper airway edema and ENT was consulted who also discussed with anesthesia and it was decided to secure airway via ETT.   Lines / Drains: PIVx2  Cultures / Sepsis markers: n/a  Antibiotics: none   Protocols / Consultants: ENT  Tests / Events: none  Overnight: Post intubation   10/12/19 - patient is mildly improved, lingual swelling is still significant but improved, lip swelling is also improved, cuff leak is present, hand edema is improved. 1/23- swelling is significantly improved, will wean sedation and perform weaning SBT with plan to liberate from MV if passing parameters   PAST MEDICAL HISTORY   Past Medical History:  Diagnosis Date  . Hypertension      SURGICAL HISTORY   Past Surgical History:  Procedure Laterality Date  . INTUBATION-ENDOTRACHEAL WITH TRACHEOSTOMY STANDBY N/A 10/09/2019   Procedure: INTUBATION-ENDOTRACHEAL WITH TRACHEOSTOMY STANDBY;  Surgeon: Beverly Gust, MD;  Location: [facility] ORS;  Service: ENT;  Laterality: N/A;     FAMILY HISTORY   History reviewed. No pertinent family history.   SOCIAL HISTORY   Social History   Tobacco Use  . Smoking status: Current Every Day Smoker    Packs/day: 0.50    Types:  Cigarettes  . Smokeless tobacco: Never Used  Substance Use Topics  . Alcohol use: Yes  . Drug use: Yes    Types: Marijuana     MEDICATIONS   Current Medication:  Current Facility-Administered Medications:  .  0.9 %  sodium chloride infusion, , Intravenous, Continuous, Whiteheart, Kathryn A, NP, Last Rate: 50 mL/hr at 10/12/19 2114, New Bag at 10/12/19 2114 .  albuterol (PROVENTIL) (2.5 MG/3ML) 0.083% nebulizer solution 2.5 mg, 2.5 mg, Nebulization, Q2H PRN, Whiteheart, Cristal Ford, NP .  Ampicillin-Sulbactam (UNASYN) 3 g in sodium chloride 0.9 % 100 mL IVPB, 3 g, Intravenous, Q6H, Janthony Holleman, MD, Last Rate: 200 mL/hr at 10/13/19 0324, 3 g at 10/13/19 0324 .  chlorhexidine gluconate (MEDLINE KIT) (PERIDEX) 0.12 % solution 15 mL, 15 mL, Mouth Rinse, BID, Lanney Gins, Maxey Ransom, MD, 15 mL at 10/12/19 1947 .  Chlorhexidine Gluconate Cloth 2 % PADS 6 each, 6 each, Topical, Daily, Beverly Gust, MD, 6 each at 10/12/19 [id number] .  dexamethasone (DECADRON) injection 10 mg, 10 mg, Intravenous, Q8H, Kirrah Mustin, MD, 10 mg at 10/13/19 0547 .  dexmedetomidine (PRECEDEX) 400 MCG/100ML (4 mcg/mL) infusion, 0.4-1.2 mcg/kg/hr, Intravenous, Titrated, Jelicia Nantz, MD, Last Rate: 22.1 mL/hr at 10/13/19 0554, 1.2 mcg/kg/hr at 10/13/19 0554 .  diphenhydrAMINE (BENADRYL) injection 50 mg, 50 mg, Intravenous, TID, Ottie Glazier, MD, 50 mg at 10/12/19 2126 .  enoxaparin (LOVENOX) injection 40 mg, 40 mg, Subcutaneous, Q24H, Lanney Gins, Camiya Vinal, MD, 40 mg at 10/12/19 1616 .  famotidine (PEPCID) IVPB 20 mg premix, 20 mg, Intravenous, Q24H, Whiteheart, Kathryn A, NP, Last Rate: 100 mL/hr at 10/12/19 2129, 20 mg at 10/12/19 2129 .  fentaNYL (SUBLIMAZE)  injection 50 mcg, 50 mcg, Intravenous, Q15 min PRN, Whiteheart, Kathryn A, NP .  fentaNYL (SUBLIMAZE) injection 50-200 mcg, 50-200 mcg, Intravenous, Q30 min PRN, Whiteheart, Kathryn A, NP, 200 mcg at 10/09/19 2139 .  fentaNYL 2552mg in NS 2573m(1067mml)  infusion-PREMIX, 0-400 mcg/hr, Intravenous, Continuous, Ogan, Okoronkwo U, MD, Last Rate: 40 mL/hr at 10/13/19 0712, 400 mcg/hr at 10/13/19 0712 .  folic acid injection 1 mg, 1 mg, Intravenous, Daily, AleLanney Ginsuad, MD, 1 mg at 10/12/19 1335 .  hydrALAZINE (APRESOLINE) injection 10 mg, 10 mg, Intravenous, Q6H PRN, KeeDarel Hong NP, 10 mg at 10/12/19 2201 .  MEDLINE mouth rinse, 15 mL, Mouth Rinse, 10 times per day, AleOttie GlazierD, 15 mL at 10/13/19 0547 .  midazolam (VERSED) 50 mg/50 mL (1 mg/mL) premix infusion, 0.5 mg/hr, Intravenous, Continuous, Shai Mckenzie, MD, Last Rate: 4 mL/hr at 10/13/19 0323, 4 mg/hr at 10/13/19 0323 .  midazolam (VERSED) injection 2 mg, 2 mg, Intravenous, Q2H PRN, AleOttie GlazierD, 2 mg at 10/10/19 1032 .  thiamine (B-1) injection 100 mg, 100 mg, Intravenous, Daily, AleLanney Ginsuad, MD, 100 mg at 10/12/19 0931    ALLERGIES   Lisinopril    REVIEW OF SYSTEMS     Unable to obtain patient is sedated on MV  PHYSICAL EXAMINATION   Vital Signs: Temp:  [97.5 F (36.4 C)-100.7 F (38.2 C)] 97.5 F (36.4 C) (01/23 0400) Pulse Rate:  [55-114] 62 (01/23 0600) Resp:  [13-18] 13 (01/23 0600) BP: (124-173)/(70-112) 124/70 (01/23 0600) SpO2:  [91 %-100 %] 96 % (01/23 0825) FiO2 (%):  [24 %] 24 % (01/23 0825) Weight:  [76.4 kg] 76.4 kg (01/23 0500)  GENERAL: Lightly sedated and intubated to RASS-0 HEAD: Normocephalic, atraumatic.  EYES: Pupils equal, round, reactive to light.  No scleral icterus.  MOUTH: Moist mucosal membrane. Lingual swelling improved, lip swelling improved, +cuff leak noted  NECK: Supple. No thyromegaly. No nodules. No JVD.  PULMONARY: CTAB CARDIOVASCULAR: S1 and S2. Regular rate and rhythm. No murmurs, rubs, or gallops.  GASTROINTESTINAL: Soft, nontender, non-distended. No masses. Positive bowel sounds. No hepatosplenomegaly.  MUSCULOSKELETAL: No swelling, clubbing, or edema.  NEUROLOGIC: Mild distress due to acute illness  GCS6T on sedation SKIN:intact,warm,dry   PERTINENT DATA     Infusions: . sodium chloride 50 mL/hr at 10/12/19 2114  . ampicillin-sulbactam (UNASYN) IV 3 g (10/13/19 0324)  . dexmedetomidine (PRECEDEX) IV infusion 1.2 mcg/kg/hr (10/13/19 0554)  . famotidine (PEPCID) IV 20 mg (10/12/19 2129)  . fentaNYL infusion INTRAVENOUS 400 mcg/hr (10/13/19 0719371. midazolam 4 mg/hr (10/13/19 0323)   Scheduled Medications: . chlorhexidine gluconate (MEDLINE KIT)  15 mL Mouth Rinse BID  . Chlorhexidine Gluconate Cloth  6 each Topical Daily  . dexamethasone (DECADRON) injection  10 mg Intravenous Q8H  . diphenhydrAMINE  50 mg Intravenous TID  . enoxaparin (LOVENOX) injection  40 mg Subcutaneous Q24H  . folic acid  1 mg Intravenous Daily  . mouth rinse  15 mL Mouth Rinse 10 times per day  . thiamine injection  100 mg Intravenous Daily   PRN Medications: albuterol, fentaNYL (SUBLIMAZE) injection, fentaNYL (SUBLIMAZE) injection, hydrALAZINE, midazolam Hemodynamic parameters:   Intake/Output: 01/22 0701 - 01/23 0700 In: -  Out: 950 [Urine:950]  Ventilator  Settings: Vent Mode: PRVC FiO2 (%):  [24 %] 24 % Set Rate:  [15 bmp] 15 bmp Vt Set:  [500 mL] 500 mL PEEP:  [5 cmH20] 5 cmH20 Plateau Pressure:  [17 cmH20] 17 cmH20    LAB RESULTS:  Basic Metabolic Panel: Recent Labs  Lab 10/09/19 1809 10/09/19 1809 10/10/19 0515 10/10/19 0941 10/11/19 0421 10/11/19 0421 10/12/19 0508 10/13/19 0549  NA 135  --  132*  --  136  --  139 138  K 3.6   < > 6.1*   < > 5.0   < > 4.3 4.2  CL 98  --  101  --  105  --  101 99  CO2 24  --  27  --  27  --  28 30  GLUCOSE 111*  --  164*  --  140*  --  136* 132*  BUN 14  --  18  --  15  --  18 17  CREATININE 0.76  --  1.17  --  0.74  --  0.67 0.55*  CALCIUM 9.6  --  8.9  --  8.9  --  9.1 9.0  MG  --   --   --   --   --   --   --  2.0  PHOS  --   --   --   --   --   --   --  4.2   < > = values in this interval not displayed.   Liver Function  Tests: Recent Labs  Lab 10/10/19 0515  AST 31  ALT 33  ALKPHOS 55  BILITOT 0.5  PROT 6.7  ALBUMIN 3.6   No results for input(s): LIPASE, AMYLASE in the last 168 hours. No results for input(s): AMMONIA in the last 168 hours. CBC: Recent Labs  Lab 10/09/19 1809 10/10/19 0515 10/11/19 0421 10/12/19 0508  WBC 6.2 6.4 13.0* 9.3  HGB 13.6 12.8* 11.6* 12.0*  HCT 39.5 37.4* 35.2* 36.0*  MCV 89.0 90.6 94.4 94.2  PLT 162 172 158 167   Cardiac Enzymes: No results for input(s): CKTOTAL, CKMB, CKMBINDEX, TROPONINI in the last 168 hours. BNP: Invalid input(s): POCBNP CBG: Recent Labs  Lab 10/09/19 1951  GLUCAP 145*     IMAGING RESULTS:  Imaging: DG Chest Port 1 View  Result Date: 10/12/2019 CLINICAL DATA:  Acute respiratory failure. EXAM: PORTABLE CHEST 1 VIEW COMPARISON:  10/09/2019. FINDINGS: Endotracheal tube tip in stable position 6.8 cm above the carina. Heart size stable. No pulmonary venous congestion. Persistent unchanged right base infiltrate. No pleural effusion or pneumothorax. No acute bony abnormality. IMPRESSION: 1. Endotracheal tube tip in stable position 6.8 cm above the carina. 2. Persistent unchanged right base infiltrate consistent with pneumonia. Electronically Signed   By: Marcello Moores  Register   On: 10/12/2019 05:33      ASSESSMENT AND PLAN    -Multidisciplinary rounds held today  Aspiration pneumonia  - Unasyn IV -respiratory culutre -bronchopulmonary hygiene -MRSA pcr negative COVID and flu negative    Severe Angioedema with upper airway edema - Famotidine -Dexamethasone 10 BID  - Diphenhyhdramine 50 IV TID -Wean Fio2 and PEEP as tolerated -will perform SAT/SBT when respiratory parameters are me ICU monitoring\ -discussed with ENT - Dr Tami Ribas - appreciate input -will attempt extubation today  Alcohol and [place] abuse  - ICU telemetry monitoring  - CIWA protocol  -follow chem 7- repelete electrolytes    ID -continue IV abx as  prescibed -follow up cultures  GI/Nutrition GI PROPHYLAXIS as indicated DIET-->TF's as tolerated Constipation protocol as indicated  ENDO - ICU hypoglycemic\Hyperglycemia protocol -check FSBS per protocol   ELECTROLYTES -follow labs as needed -replace as needed -pharmacy consultation   DVT/GI PRX ordered -SCDs  TRANSFUSIONS AS NEEDED MONITOR  FSBS ASSESS the need for LABS as needed   Critical care provider statement:    Critical care time (minutes):  33   Critical care time was exclusive of:  Separately billable procedures and treating other patients   Critical care was necessary to treat or prevent imminent or life-threatening deterioration of the following conditions:  severe angioedema,essential HTN, alcoholism, drug abuse, aspiration pneumonia   Critical care was time spent personally by me on the following activities:  Development of treatment plan with patient or surrogate, discussions with consultants, evaluation of patient's response to treatment, examination of patient, obtaining history from patient or surrogate, ordering and performing treatments and interventions, ordering and review of laboratory studies and re-evaluation of patient's condition.  I assumed direction of critical care for this patient from another provider in my specialty: no    This document was prepared using Dragon voice recognition software and may include unintentional dictation errors.    Ottie Glazier, M.D.  Division of [place]

## 2019-10-14 LAB — CBC WITH DIFFERENTIAL/PLATELET
Abs Immature Granulocytes: 0.06 10*3/uL (ref 0.00–0.07)
Basophils Absolute: 0 10*3/uL (ref 0.0–0.1)
Basophils Relative: 0 %
Eosinophils Absolute: 0 10*3/uL (ref 0.0–0.5)
Eosinophils Relative: 0 %
HCT: 36.7 % — ABNORMAL LOW (ref 39.0–52.0)
Hemoglobin: 12.4 g/dL — ABNORMAL LOW (ref 13.0–17.0)
Immature Granulocytes: 1 %
Lymphocytes Relative: 3 %
Lymphs Abs: 0.4 10*3/uL — ABNORMAL LOW (ref 0.7–4.0)
MCH: 31 pg (ref 26.0–34.0)
MCHC: 33.8 g/dL (ref 30.0–36.0)
MCV: 91.8 fL (ref 80.0–100.0)
Monocytes Absolute: 0.9 10*3/uL (ref 0.1–1.0)
Monocytes Relative: 8 %
Neutro Abs: 10.1 10*3/uL — ABNORMAL HIGH (ref 1.7–7.7)
Neutrophils Relative %: 88 %
Platelets: 222 10*3/uL (ref 150–400)
RBC: 4 MIL/uL — ABNORMAL LOW (ref 4.22–5.81)
RDW: 14.5 % (ref 11.5–15.5)
WBC: 11.5 10*3/uL — ABNORMAL HIGH (ref 4.0–10.5)
nRBC: 0 % (ref 0.0–0.2)

## 2019-10-14 MED ORDER — DEXAMETHASONE SODIUM PHOSPHATE 10 MG/ML IJ SOLN
10.0000 mg | INTRAMUSCULAR | Status: DC
  Administered 2019-10-15: 10 mg via INTRAVENOUS
  Filled 2019-10-14: qty 1

## 2019-10-14 MED ORDER — NICOTINE 14 MG/24HR TD PT24
14.0000 mg | MEDICATED_PATCH | Freq: Every day | TRANSDERMAL | Status: DC
  Administered 2019-10-14: 14 mg via TRANSDERMAL
  Filled 2019-10-14 (×2): qty 1

## 2019-10-14 MED ORDER — TRAMADOL HCL 50 MG PO TABS
100.0000 mg | ORAL_TABLET | Freq: Three times a day (TID) | ORAL | Status: DC | PRN
  Administered 2019-10-14: 100 mg via ORAL
  Filled 2019-10-14 (×2): qty 2

## 2019-10-14 NOTE — Progress Notes (Signed)
CRITICAL CARE NOTE    Name: Michael Ingram MRN: [id number] DOB: 1970/11/12     LOS: 5   SUBJECTIVE FINDINGS & SIGNIFICANT EVENTS   Patient description:   49 yo M came in to ED due to sensation of dysphagia post prandially.  He reported eating spicy food.  While in ED he reported sensation of neck swelling, also noted digital edema newly formed. Patient started lisinopril today for his BP which has been uncontrolled. He is an active user of high volume EtOH, per wife patient "drinks a firth per night". Patient also uses THC. While in ED it was noted he had upper airway edema and ENT was consulted who also discussed with anesthesia and it was decided to secure airway via ETT.   Lines / Drains: PIVx2  Cultures / Sepsis markers: n/a  Antibiotics: none   Protocols / Consultants: ENT  Tests / Events: none  Overnight: Post intubation   10/12/19 - patient is mildly improved, lingual swelling is still significant but improved, lip swelling is also improved, cuff leak is present, hand edema is improved. 1/23- swelling is significantly improved, will wean sedation and perform weaning SBT with plan to liberate from MV if passing parameters 1/24- patient is now 24h post liberation from MV, noted diaphoresis, tachycardia patient admits to alcohol withdrawal and wants to speak to psychiatrist.  Signed out to Dr Mal Misty for hospitalist pick up on 1/25  PAST MEDICAL HISTORY   Past Medical History:  Diagnosis Date  . Hypertension      SURGICAL HISTORY   Past Surgical History:  Procedure Laterality Date  . INTUBATION-ENDOTRACHEAL WITH TRACHEOSTOMY STANDBY N/A 10/09/2019   Procedure: INTUBATION-ENDOTRACHEAL WITH TRACHEOSTOMY STANDBY;  Surgeon: Beverly Gust, MD;  Location: [facility] ORS;  Service: ENT;  Laterality:  N/A;     FAMILY HISTORY   History reviewed. No pertinent family history.   SOCIAL HISTORY   Social History   Tobacco Use  . Smoking status: Current Every Day Smoker    Packs/day: 0.50    Types: Cigarettes  . Smokeless tobacco: Never Used  Substance Use Topics  . Alcohol use: Yes  . Drug use: Yes    Types: Marijuana     MEDICATIONS   Current Medication:  Current Facility-Administered Medications:  .  0.9 %  sodium chloride infusion, , Intravenous, Continuous, Whiteheart, Kathryn A, NP, Stopped at 10/13/19 1235 .  albuterol (PROVENTIL) (2.5 MG/3ML) 0.083% nebulizer solution 2.5 mg, 2.5 mg, Nebulization, Q2H PRN, Whiteheart, Cristal Ford, NP .  Ampicillin-Sulbactam (UNASYN) 3 g in sodium chloride 0.9 % 100 mL IVPB, 3 g, Intravenous, Q6H, Ottie Glazier, MD, Stopped at 10/14/19 1023 .  Chlorhexidine Gluconate Cloth 2 % PADS 6 each, 6 each, Topical, Daily, Beverly Gust, MD, 6 each at 10/14/19 1116 .  cloNIDine (CATAPRES) tablet 0.1 mg, 0.1 mg, Oral, TID, Lanney Gins, Kaleigh Spiegelman, MD, 0.1 mg at 10/14/19 0954 .  [START ON 10/15/2019] dexamethasone (DECADRON) injection 10 mg, 10 mg, Intravenous, Q24H, Uyen Eichholz, MD .  dexmedetomidine (PRECEDEX) 400 MCG/100ML (4 mcg/mL) infusion, 0.4-1.2 mcg/kg/hr, Intravenous, Titrated, Lolita Faulds, MD, Last Rate: 7.38 mL/hr at 10/14/19 1200, 0.4 mcg/kg/hr at 10/14/19 1200 .  enoxaparin (LOVENOX) injection 40 mg, 40 mg, Subcutaneous, Q24H, Lanney Gins, Amarion Portell, MD, 40 mg at 10/13/19 1636 .  folic acid (FOLVITE) tablet 1 mg, 1 mg, Oral, Daily, Blakeney, Dana G, NP, 1 mg at 10/13/19 2219 .  hydrALAZINE (APRESOLINE) injection 10 mg, 10 mg, Intravenous, Q6H PRN, Darel Hong D, NP, 10 mg at 10/13/19  1408 .  LORazepam (ATIVAN) tablet 1-4 mg, 1-4 mg, Oral, Q1H PRN, 1 mg at 10/14/19 1222 **OR** LORazepam (ATIVAN) injection 1-4 mg, 1-4 mg, Intravenous, Q1H PRN, Eugenie Norrie, NP .  multivitamin with minerals tablet 1 tablet, 1 tablet, Oral, Daily,  Eugenie Norrie, NP, 1 tablet at 10/13/19 2218 .  ondansetron (ZOFRAN) injection 4 mg, 4 mg, Intravenous, Q6H PRN, Vida Rigger, MD, 4 mg at 10/13/19 1644 .  thiamine tablet 100 mg, 100 mg, Oral, Daily, 100 mg at 10/13/19 2219 **OR** thiamine (B-1) injection 100 mg, 100 mg, Intravenous, Daily, Eugenie Norrie, NP, 100 mg at 10/14/19 1610    ALLERGIES   Lisinopril    REVIEW OF SYSTEMS     Unable to obtain patient is sedated on MV  PHYSICAL EXAMINATION   Vital Signs: Temp:  [98.2 F (36.8 C)-98.4 F (36.9 C)] 98.2 F (36.8 C) (01/24 0800) Pulse Rate:  [64-105] 77 (01/24 1200) Resp:  [0-22] 18 (01/24 1100) BP: (145-175)/(82-110) 175/109 (01/24 1200) SpO2:  [95 %-100 %] 97 % (01/24 1100)  GENERAL: Lightly sedated and intubated to RASS-0 HEAD: Normocephalic, atraumatic.  EYES: Pupils equal, round, reactive to light.  No scleral icterus.  MOUTH: Moist mucosal membrane. Lingual and upper airway swelling resolved NECK: Supple. No thyromegaly. No nodules. No JVD.  PULMONARY: CTAB CARDIOVASCULAR: S1 and S2. Regular rate and rhythm. No murmurs, rubs, or gallops.  GASTROINTESTINAL: Soft, nontender, non-distended. No masses. Positive bowel sounds. No hepatosplenomegaly.  MUSCULOSKELETAL: No swelling, clubbing, peripheral edema resolved NEUROLOGIC: Mild distress due to acute illness GCS6T on sedation SKIN:intact,warm,dry   PERTINENT DATA     Infusions: . sodium chloride Stopped (10/13/19 1235)  . ampicillin-sulbactam (UNASYN) IV Stopped (10/14/19 1023)  . dexmedetomidine (PRECEDEX) IV infusion 0.4 mcg/kg/hr (10/14/19 1200)   Scheduled Medications: . Chlorhexidine Gluconate Cloth  6 each Topical Daily  . cloNIDine  0.1 mg Oral TID  . [START ON 10/15/2019] dexamethasone (DECADRON) injection  10 mg Intravenous Q24H  . enoxaparin (LOVENOX) injection  40 mg Subcutaneous Q24H  . folic acid  1 mg Oral Daily  . multivitamin with minerals  1 tablet Oral Daily  . thiamine  100  mg Oral Daily   Or  . thiamine  100 mg Intravenous Daily   PRN Medications: albuterol, hydrALAZINE, LORazepam **OR** LORazepam, ondansetron (ZOFRAN) IV Hemodynamic parameters:   Intake/Output: 01/23 0701 - 01/24 0700 In: 1230.7 [I.V.:282.9; IV Piggyback:947.8] Out: 1350 [Urine:850; Emesis/NG output:500]  Ventilator  Settings:      LAB RESULTS:  Basic Metabolic Panel: Recent Labs  Lab 10/09/19 1809 10/09/19 1809 10/10/19 0515 10/10/19 0941 10/11/19 0421 10/11/19 0421 10/12/19 0508 10/13/19 0549  NA 135  --  132*  --  136  --  139 138  K 3.6   < > 6.1*   < > 5.0   < > 4.3 4.2  CL 98  --  101  --  105  --  101 99  CO2 24  --  27  --  27  --  28 30  GLUCOSE 111*  --  164*  --  140*  --  136* 132*  BUN 14  --  18  --  15  --  18 17  CREATININE 0.76  --  1.17  --  0.74  --  0.67 0.55*  CALCIUM 9.6  --  8.9  --  8.9  --  9.1 9.0  MG  --   --   --   --   --   --   --  2.0  PHOS  --   --   --   --   --   --   --  4.2   < > = values in this interval not displayed.   Liver Function Tests: Recent Labs  Lab 10/10/19 0515  AST 31  ALT 33  ALKPHOS 55  BILITOT 0.5  PROT 6.7  ALBUMIN 3.6   No results for input(s): LIPASE, AMYLASE in the last 168 hours. No results for input(s): AMMONIA in the last 168 hours. CBC: Recent Labs  Lab 10/09/19 1809 10/10/19 0515 10/11/19 0421 10/12/19 0508 10/14/19 0452  WBC 6.2 6.4 13.0* 9.3 11.5*  NEUTROABS  --   --   --   --  10.1*  HGB 13.6 12.8* 11.6* 12.0* 12.4*  HCT 39.5 37.4* 35.2* 36.0* 36.7*  MCV 89.0 90.6 94.4 94.2 91.8  PLT 162 172 158 167 222   Cardiac Enzymes: No results for input(s): CKTOTAL, CKMB, CKMBINDEX, TROPONINI in the last 168 hours. BNP: Invalid input(s): POCBNP CBG: Recent Labs  Lab 10/09/19 1951  GLUCAP 145*     IMAGING RESULTS:  Imaging: No results found.    ASSESSMENT AND PLAN    -Multidisciplinary rounds held today  Aspiration pneumonia  - Unasyn IV -respiratory  culutre -bronchopulmonary hygiene -MRSA pcr negative COVID and flu negative    Severe Angioedema with upper airway edema - Famotidine -Dexamethasone 10 BID  - Diphenhyhdramine 50 IV TID -Wean Fio2 and PEEP as tolerated -will perform SAT/SBT when respiratory parameters are me ICU monitoring\ -discussed with ENT - Dr Jenne Campus - appreciate input -will attempt extubation today  Alcohol and TCH abuse  - ICU telemetry monitoring  - CIWA protocol  -follow chem 7- repelete electrolytes -precedex gtt intermittently due to withdrawal refractory to clonidine and ativan - psychiatry consultation - patient and wife asked if they can have some time to speak to psychiatrist   ID -continue IV abx as prescibed -follow up cultures  GI/Nutrition GI PROPHYLAXIS as indicated DIET-->TF's as tolerated Constipation protocol as indicated  ENDO - ICU hypoglycemic\Hyperglycemia protocol -check FSBS per protocol   ELECTROLYTES -follow labs as needed -replace as needed -pharmacy consultation   DVT/GI PRX ordered -SCDs  TRANSFUSIONS AS NEEDED MONITOR FSBS ASSESS the need for LABS as needed   Critical care provider statement:    Critical care time (minutes):  33   Critical care time was exclusive of:  Separately billable procedures and treating other patients   Critical care was necessary to treat or prevent imminent or life-threatening deterioration of the following conditions:  severe angioedema,essential HTN, alcoholism, drug abuse, aspiration pneumonia   Critical care was time spent personally by me on the following activities:  Development of treatment plan with patient or surrogate, discussions with consultants, evaluation of patient's response to treatment, examination of patient, obtaining history from patient or surrogate, ordering and performing treatments and interventions, ordering and review of laboratory studies and re-evaluation of patient's condition.  I assumed direction of  critical care for this patient from another provider in my specialty: no    This document was prepared using Dragon voice recognition software and may include unintentional dictation errors.    Vida Rigger, M.D.  Division of Pulmonary & Critical Care Medicine  [facility]

## 2019-10-14 NOTE — Progress Notes (Signed)
Follow up visit with patient who was sleeping, I had the opportunity to meet his wife. She is pleased at his progress, and hope he will move out of ICU soon. I assured her the Chaplain staff is available if and when needed.

## 2019-10-14 NOTE — Progress Notes (Signed)
Michael Ingram is a 49 year old man with history significant for hypertension and alcohol use disorder who presented to the hospital with severe angioedema.  He was intubated for airway protection and acute respiratory failure.  He developed alcohol withdrawal syndrome in the ICU.  He has been extubated and will be transferred to stepdown unit for further management.  The hospitalist team was consulted to continue with medical management.  The hospitalist team will take over care of this patient tomorrow.

## 2019-10-14 NOTE — Progress Notes (Signed)
PHARMACY CONSULT NOTE  Pharmacy Consult for Electrolyte Monitoring and Replacement   Recent Labs: Potassium (mmol/L)  Date Value  10/13/2019 4.2   Magnesium (mg/dL)  Date Value  44/71/5806 2.0   Calcium (mg/dL)  Date Value  38/68/5488 9.0   Albumin (g/dL)  Date Value  30/14/1597 3.6   Phosphorus (mg/dL)  Date Value  33/08/5086 4.2   Sodium (mmol/L)  Date Value  10/13/2019 138     Assessment: 49 year old male intubated following angioedema, expected to be related to recent ACE inhibitor start. Current plan is to extubate 1/21.  Goal of Therapy:  Electrolytes WNL  Plan:  Potassium continues to improve. Will plan to check all electrolytes q48H as patient intubated, unable to provide nutrition.   Ronnald Ramp ,PharmD, BCPS Clinical Pharmacist 10/14/2019 8:08 AM

## 2019-10-14 NOTE — Progress Notes (Signed)
Ch encountered patient after another visit. He was sitting up looking out. I asked if I could come in he permitted. After introducing myself I asked how he was and assured him he sitting up as I went by was a welcoming sight for me. He shared with me he was glad to be alive and doing better than before. He began to move around uncomfortably, he had just been moved back to bed , from chair. He also stated he would began therapy today. I asked and he allowed me to pray for him. I promised to follow up. After reading notes, Thayer Ohm has also encountered patient and family member, we will continue to follow up for support, if needed.

## 2019-10-15 DIAGNOSIS — T464X5A Adverse effect of angiotensin-converting-enzyme inhibitors, initial encounter: Secondary | ICD-10-CM

## 2019-10-15 DIAGNOSIS — J9601 Acute respiratory failure with hypoxia: Secondary | ICD-10-CM

## 2019-10-15 DIAGNOSIS — T783XXA Angioneurotic edema, initial encounter: Principal | ICD-10-CM

## 2019-10-15 DIAGNOSIS — I10 Essential (primary) hypertension: Secondary | ICD-10-CM

## 2019-10-15 LAB — CBC WITH DIFFERENTIAL/PLATELET
Abs Immature Granulocytes: 0.03 10*3/uL (ref 0.00–0.07)
Basophils Absolute: 0 10*3/uL (ref 0.0–0.1)
Basophils Relative: 0 %
Eosinophils Absolute: 0 10*3/uL (ref 0.0–0.5)
Eosinophils Relative: 0 %
HCT: 33.2 % — ABNORMAL LOW (ref 39.0–52.0)
Hemoglobin: 11.7 g/dL — ABNORMAL LOW (ref 13.0–17.0)
Immature Granulocytes: 0 %
Lymphocytes Relative: 21 %
Lymphs Abs: 2 10*3/uL (ref 0.7–4.0)
MCH: 31.1 pg (ref 26.0–34.0)
MCHC: 35.2 g/dL (ref 30.0–36.0)
MCV: 88.3 fL (ref 80.0–100.0)
Monocytes Absolute: 1.5 10*3/uL — ABNORMAL HIGH (ref 0.1–1.0)
Monocytes Relative: 16 %
Neutro Abs: 5.8 10*3/uL (ref 1.7–7.7)
Neutrophils Relative %: 63 %
Platelets: 210 10*3/uL (ref 150–400)
RBC: 3.76 MIL/uL — ABNORMAL LOW (ref 4.22–5.81)
RDW: 13.9 % (ref 11.5–15.5)
WBC: 9.4 10*3/uL (ref 4.0–10.5)
nRBC: 0 % (ref 0.0–0.2)

## 2019-10-15 LAB — BASIC METABOLIC PANEL
Anion gap: 12 (ref 5–15)
BUN: 16 mg/dL (ref 6–20)
CO2: 25 mmol/L (ref 22–32)
Calcium: 8.8 mg/dL — ABNORMAL LOW (ref 8.9–10.3)
Chloride: 96 mmol/L — ABNORMAL LOW (ref 98–111)
Creatinine, Ser: 0.57 mg/dL — ABNORMAL LOW (ref 0.61–1.24)
GFR calc Af Amer: >60 mL/min (ref >60)
GFR calc non Af Amer: >60 mL/min (ref >60)
Glucose, Bld: 89 mg/dL (ref 70–99)
Potassium: 2.9 mmol/L — ABNORMAL LOW (ref 3.5–5.1)
Sodium: 133 mmol/L — ABNORMAL LOW (ref 135–145)

## 2019-10-15 LAB — PHOSPHORUS: Phosphorus: 3.2 mg/dL (ref 2.5–4.6)

## 2019-10-15 LAB — MAGNESIUM: Magnesium: 1.8 mg/dL (ref 1.7–2.4)

## 2019-10-15 MED ORDER — AMLODIPINE BESYLATE 5 MG PO TABS
5.0000 mg | ORAL_TABLET | Freq: Every day | ORAL | Status: DC
  Administered 2019-10-15: 5 mg via ORAL
  Filled 2019-10-15: qty 1

## 2019-10-15 MED ORDER — POTASSIUM CHLORIDE CRYS ER 20 MEQ PO TBCR
40.0000 meq | EXTENDED_RELEASE_TABLET | ORAL | Status: AC
  Administered 2019-10-15 (×2): 40 meq via ORAL
  Filled 2019-10-15 (×2): qty 2

## 2019-10-15 MED ORDER — CLONIDINE HCL 0.1 MG PO TABS
0.1000 mg | ORAL_TABLET | Freq: Every day | ORAL | Status: DC
  Administered 2019-10-15: 0.1 mg via ORAL
  Filled 2019-10-15: qty 1

## 2019-10-15 MED ORDER — AMLODIPINE BESYLATE 5 MG PO TABS: 5.0000 mg | ORAL_TABLET | Freq: Every day | ORAL | 0 refills | Status: DC

## 2019-10-15 MED ORDER — THIAMINE HCL 100 MG PO TABS: 100.0000 mg | ORAL_TABLET | Freq: Every day | ORAL | 0 refills | Status: DC

## 2019-10-15 MED ORDER — NICOTINE 14 MG/24HR TD PT24: 14.0000 mg | MEDICATED_PATCH | Freq: Every day | TRANSDERMAL | 0 refills | Status: DC

## 2019-10-15 MED ORDER — MAGNESIUM SULFATE 2 GM/50ML IV SOLN
2.0000 g | Freq: Once | INTRAVENOUS | Status: AC
  Administered 2019-10-15: 2 g via INTRAVENOUS
  Filled 2019-10-15: qty 50

## 2019-10-15 NOTE — Plan of Care (Signed)
POC discussed with pt and family member. A&O. C/o pain managed with PRN meds, on CIWA, managed per protocol.  In NSR/ SB. Afebrile. On RA, tolerating well. Able to void in urinal, no BM this shift. Skin intact, remains on precedex gtt for comfort. No other issues noted.

## 2019-10-15 NOTE — Discharge Summary (Signed)
Physician Discharge Summary  Michael Ingram KCL:[id number] DOB: 1971/06/08 DOA: 10/09/2019  PCP: System, Pcp Not In  Admit date: 10/09/2019 Discharge date: 10/15/2019  Admitted From: Home Disposition: Home  Recommendations for Outpatient Follow-up:  1. Follow up with PCP in 1-2 weeks 2. Please obtain BMP/CBC in one week 3. Please follow up on the following pending results: None  Home Health: No Equipment/Devices: None Discharge Condition: Stable CODE STATUS: Full Diet recommendation: Heart Healthy   Brief/Interim Summary: 49 yo M came in to ED due to sensation of dysphagia post prandially.  He reported eating spicy food.  While in ED he reported sensation of neck swelling, also noted digital edema newly formed. Patient started lisinopril today for his BP which has been uncontrolled. He is an active user of high volume EtOH, per wife patient "drinks a firth per night". Patient also uses THC. While in ED it was noted he had upper airway edema and ENT was consulted who also discussed with anesthesia and it was decided to secure airway via ETT.   He also experience alcohol withdrawal symptoms managed with Precedex. Initially multiple failed weaning trials, eventually extubated on 10/13/19.  Patient improved significantly since then.  Saturating well on room air.  Tolerating regular diet.  With no residual edema.  Chest x-ray with concerning of aspiration, treated with 5-day course of Unasyn.  No antibiotics on discharge.  He was initially started on clonidine in ICU, switched with amlodipine.  We discussed that he should avoid all ACE inhibitors and ARB's due to this reaction. He needs a close follow-up with PCP for further management of his hypertension.  He does need Precedex for initial management of severe alcohol withdrawal symptoms.  Gradually weaned off.  His CIWA score was 0 on discharge. We discussed staying away from alcohol, patient seems understanding.  Wife was at bedside who  seems supportive.  He will need continuous counseling from his PCP to stay sober.  Discharge Diagnoses:  Active Problems:   Angioedema   Acute respiratory failure [facility])  Discharge Instructions  Discharge Instructions    Diet - low sodium heart healthy   Complete by: As directed    Discharge instructions   Complete by: As directed    It was pleasure taking care of you. I am starting you on this new medicine called amlodipine for your blood pressure. You cannot take any blood pressure pill with the class of ACE inhibitor or ARB.  Your primary care physician should be able to recommend according to your allergies. Please follow-up with your primary care physician within next few days for management of your blood pressure. Keep checking your blood pressure at home and keep a log, take that log with you while going to see your primary care physician so they can adjust your dosage and medications accordingly.   Increase activity slowly   Complete by: As directed      Allergies as of 10/15/2019      Reactions   Lisinopril Anaphylaxis   Angioedema requiring intubation      Medication List    STOP taking these medications   lisinopril 10 MG tablet Commonly known as: ZESTRIL     TAKE these medications   amLODipine 5 MG tablet Commonly known as: NORVASC Take 1 tablet (5 mg total) by mouth daily. Start taking on: October 16, 2019   nicotine 14 mg/24hr patch Commonly known as: NICODERM CQ - dosed in mg/24 hours Place 1 patch (14 mg total) onto the skin  daily. Start taking on: October 16, 2019   thiamine 100 MG tablet Take 1 tablet (100 mg total) by mouth daily. Start taking on: October 16, 2019       Allergies  Allergen Reactions  . Lisinopril Anaphylaxis    Angioedema requiring intubation    Consultations:  PCCM  Procedures/Studies: DG Chest Port 1 View  Result Date: 10/12/2019 CLINICAL DATA:  Acute respiratory failure. EXAM: PORTABLE CHEST 1 VIEW COMPARISON:   10/09/2019. FINDINGS: Endotracheal tube tip in stable position 6.8 cm above the carina. Heart size stable. No pulmonary venous congestion. Persistent unchanged right base infiltrate. No pleural effusion or pneumothorax. No acute bony abnormality. IMPRESSION: 1. Endotracheal tube tip in stable position 6.8 cm above the carina. 2. Persistent unchanged right base infiltrate consistent with pneumonia. Electronically Signed   By: Maisie Fus  Register   On: 10/12/2019 05:33   DG Chest Port 1 View  Result Date: 10/09/2019 CLINICAL DATA:  49 year old male status post intubation. EXAM: PORTABLE CHEST 1 VIEW COMPARISON:  None. FINDINGS: Endotracheal tube with tip approximately 6.8 cm above the carina. Patchy right lung base density may represent atelectasis or infiltrate. Clinical correlation is recommended. There is no pleural effusion or pneumothorax. The cardiac silhouette is within normal is. No acute osseous pathology. IMPRESSION: 1. Endotracheal tube above the carina. 2. Right lung base atelectasis versus infiltrate. Electronically Signed   By: Elgie Collard M.D.   On: 10/09/2019 21:15    Subjective: Patient was feeling better when seen this morning.  Able to tolerate diet very well.  He feels comfortable.  Accompanied by his wife and wants to go home. We discussed about taking amlodipine for blood pressure.  He should not be taking any ACE inhibitor or ARB's.  He needs a close follow-up with PCP for further management.  We also discussed regarding staying away from alcohol.  Patient seems understanding.  Discharge Exam: Vitals:   10/15/19 0500 10/15/19 0600  BP: (!) 128/91 (!) 138/98  Pulse: 60 66  Resp: 20 12  Temp:    SpO2: 95% 94%   Vitals:   10/15/19 0300 10/15/19 0400 10/15/19 0500 10/15/19 0600  BP: (!) 142/92 (!) 149/89 (!) 128/91 (!) 138/98  Pulse: 63 60 60 66  Resp: 19 17 20 12   Temp:  98.6 F (37 C)    TempSrc:      SpO2: 96% 94% 95% 94%  Weight:   76 kg   Height:         General: Pt is alert, awake, not in acute distress Cardiovascular: RRR, S1/S2 +, no rubs, no gallops Respiratory: CTA bilaterally, no wheezing, no rhonchi Abdominal: Soft, NT, ND, bowel sounds + Extremities: no edema, no cyanosis   The results of significant diagnostics from this hospitalization (including imaging, microbiology, ancillary and laboratory) are listed below for reference.    Microbiology: Recent Results (from the past 240 hour(s))  Respiratory Panel by RT PCR (Flu A&B, Covid) - Nasopharyngeal Swab     Status: None   Collection Time: 10/09/19  6:09 PM   Specimen: Nasopharyngeal Swab  Result Value Ref Range Status   SARS Coronavirus 2 by RT PCR NEGATIVE NEGATIVE Final    Comment: (NOTE) SARS-CoV-2 target nucleic acids are NOT DETECTED. The SARS-CoV-2 RNA is generally detectable in upper respiratoy specimens during the acute phase of infection. The lowest concentration of SARS-CoV-2 viral copies this assay can detect is 131 copies/mL. A negative result does not preclude SARS-Cov-2 infection and should not be used as the  sole basis for treatment or other patient management decisions. A negative result may occur with  improper specimen collection/handling, submission of specimen other than nasopharyngeal swab, presence of viral mutation(s) within the areas targeted by this assay, and inadequate number of viral copies (<131 copies/mL). A negative result must be combined with clinical observations, patient history, and epidemiological information. The expected result is Negative. Fact Sheet for Patients:  https://www.moore.com/ Fact Sheet for Healthcare Providers:  https://www.young.biz/ This test is not yet ap proved or cleared by the [place] FDA and  has been authorized for detection and/or diagnosis of SARS-CoV-2 by FDA under an Emergency Use Authorization (EUA). This EUA will remain  in effect (meaning this test can be  used) for the duration of the COVID-19 declaration under Section 564(b)(1) of the Act, 21 U.S.C. section 360bbb-3(b)(1), unless the authorization is terminated or revoked sooner.    Influenza A by PCR NEGATIVE NEGATIVE Final   Influenza B by PCR NEGATIVE NEGATIVE Final    Comment: (NOTE) The Xpert Xpress SARS-CoV-2/FLU/RSV assay is intended as an aid in  the diagnosis of influenza from Nasopharyngeal swab specimens and  should not be used as a sole basis for treatment. Nasal washings and  aspirates are unacceptable for Xpert Xpress SARS-CoV-2/FLU/RSV  testing. Fact Sheet for Patients: https://www.moore.com/ Fact Sheet for Healthcare Providers: https://www.young.biz/ This test is not yet approved or cleared by the [place] FDA and  has been authorized for detection and/or diagnosis of SARS-CoV-2 by  FDA under an Emergency Use Authorization (EUA). This EUA will remain  in effect (meaning this test can be used) for the duration of the  Covid-19 declaration under Section 564(b)(1) of the Act, 21  U.S.C. section 360bbb-3(b)(1), unless the authorization is  terminated or revoked. Performed at [facility], [address] [place], [place] [address]   MRSA PCR Screening     Status: None   Collection Time: 10/09/19  8:00 PM   Specimen: Nasal Mucosa; Nasopharyngeal  Result Value Ref Range Status   MRSA by PCR NEGATIVE NEGATIVE Final    Comment:        The GeneXpert MRSA Assay (FDA approved for NASAL specimens only), is one component of a comprehensive MRSA colonization surveillance program. It is not intended to diagnose MRSA infection nor to guide or monitor treatment for MRSA infections. Performed at [facility], [address] [place], [place] [address]      Labs: BNP (last 3 results) No results for input(s): BNP in the last 8760 hours. Basic Metabolic Panel: Recent Labs  Lab 10/10/19 0515 10/10/19 0515  10/10/19 0941 10/11/19 0421 10/12/19 0508 10/13/19 0549 10/15/19 0440  NA 132*  --   --  136 139 138 133*  K 6.1*   < > 5.3* 5.0 4.3 4.2 2.9*  CL 101  --   --  105 101 99 96*  CO2 27  --   --  27 28 30 25   GLUCOSE 164*  --   --  140* 136* 132* 89  BUN 18  --   --  15 18 17 16   CREATININE 1.17  --   --  0.74 0.67 0.55* 0.57*  CALCIUM 8.9  --   --  8.9 9.1 9.0 8.8*  MG  --   --   --   --   --  2.0 1.8  PHOS  --   --   --   --   --  4.2 3.2   < > =  values in this interval not displayed.   Liver Function Tests: Recent Labs  Lab 10/10/19 0515  AST 31  ALT 33  ALKPHOS 55  BILITOT 0.5  PROT 6.7  ALBUMIN 3.6   No results for input(s): LIPASE, AMYLASE in the last 168 hours. No results for input(s): AMMONIA in the last 168 hours. CBC: Recent Labs  Lab 10/10/19 0515 10/11/19 0421 10/12/19 0508 10/14/19 0452 10/15/19 0440  WBC 6.4 13.0* 9.3 11.5* 9.4  NEUTROABS  --   --   --  10.1* 5.8  HGB 12.8* 11.6* 12.0* 12.4* 11.7*  HCT 37.4* 35.2* 36.0* 36.7* 33.2*  MCV 90.6 94.4 94.2 91.8 88.3  PLT 172 158 167 222 210   Cardiac Enzymes: No results for input(s): CKTOTAL, CKMB, CKMBINDEX, TROPONINI in the last 168 hours. BNP: Invalid input(s): POCBNP CBG: Recent Labs  Lab 10/09/19 1951  GLUCAP 145*   D-Dimer No results for input(s): DDIMER in the last 72 hours. Hgb A1c No results for input(s): HGBA1C in the last 72 hours. Lipid Profile Recent Labs    10/13/19 0549  TRIG 123   Thyroid function studies No results for input(s): TSH, T4TOTAL, T3FREE, THYROIDAB in the last 72 hours.  Invalid input(s): FREET3 Anemia work up No results for input(s): VITAMINB12, FOLATE, FERRITIN, TIBC, IRON, RETICCTPCT in the last 72 hours. Urinalysis No results found for: COLORURINE, APPEARANCEUR, LABSPEC, PHURINE, GLUCOSEU, HGBUR, BILIRUBINUR, KETONESUR, PROTEINUR, UROBILINOGEN, NITRITE, LEUKOCYTESUR Sepsis Labs Invalid input(s): PROCALCITONIN,  WBC,  LACTICIDVEN Microbiology Recent  Results (from the past 240 hour(s))  Respiratory Panel by RT PCR (Flu A&B, Covid) - Nasopharyngeal Swab     Status: None   Collection Time: 10/09/19  6:09 PM   Specimen: Nasopharyngeal Swab  Result Value Ref Range Status   SARS Coronavirus 2 by RT PCR NEGATIVE NEGATIVE Final    Comment: (NOTE) SARS-CoV-2 target nucleic acids are NOT DETECTED. The SARS-CoV-2 RNA is generally detectable in upper respiratoy specimens during the acute phase of infection. The lowest concentration of SARS-CoV-2 viral copies this assay can detect is 131 copies/mL. A negative result does not preclude SARS-Cov-2 infection and should not be used as the sole basis for treatment or other patient management decisions. A negative result may occur with  improper specimen collection/handling, submission of specimen other than nasopharyngeal swab, presence of viral mutation(s) within the areas targeted by this assay, and inadequate number of viral copies (<131 copies/mL). A negative result must be combined with clinical observations, patient history, and epidemiological information. The expected result is Negative. Fact Sheet for Patients:  https://www.moore.com/ Fact Sheet for Healthcare Providers:  https://www.young.biz/ This test is not yet ap proved or cleared by the [place] FDA and  has been authorized for detection and/or diagnosis of SARS-CoV-2 by FDA under an Emergency Use Authorization (EUA). This EUA will remain  in effect (meaning this test can be used) for the duration of the COVID-19 declaration under Section 564(b)(1) of the Act, 21 U.S.C. section 360bbb-3(b)(1), unless the authorization is terminated or revoked sooner.    Influenza A by PCR NEGATIVE NEGATIVE Final   Influenza B by PCR NEGATIVE NEGATIVE Final    Comment: (NOTE) The Xpert Xpress SARS-CoV-2/FLU/RSV assay is intended as an aid in  the diagnosis of influenza from Nasopharyngeal swab specimens  and  should not be used as a sole basis for treatment. Nasal washings and  aspirates are unacceptable for Xpert Xpress SARS-CoV-2/FLU/RSV  testing. Fact Sheet for Patients: https://www.moore.com/ Fact Sheet for Healthcare Providers: https://www.young.biz/ This test  is not yet approved or cleared by the [place] FDA and  has been authorized for detection and/or diagnosis of SARS-CoV-2 by  FDA under an Emergency Use Authorization (EUA). This EUA will remain  in effect (meaning this test can be used) for the duration of the  Covid-19 declaration under Section 564(b)(1) of the Act, 21  U.S.C. section 360bbb-3(b)(1), unless the authorization is  terminated or revoked. Performed at [facility], [address] [place], [place] [address]   MRSA PCR Screening     Status: None   Collection Time: 10/09/19  8:00 PM   Specimen: Nasal Mucosa; Nasopharyngeal  Result Value Ref Range Status   MRSA by PCR NEGATIVE NEGATIVE Final    Comment:        The GeneXpert MRSA Assay (FDA approved for NASAL specimens only), is one component of a comprehensive MRSA colonization surveillance program. It is not intended to diagnose MRSA infection nor to guide or monitor treatment for MRSA infections. Performed at [facility], [address] [place], [place] [address]     Time coordinating discharge: Over 30 minutes  SIGNED:  Arnetha CourserSumayya Cheron Coryell, MD  [facility] 10/15/2019, 12:29 PM Pager [phone]  If 7PM-7AM, please contact night-coverage [email or web address] Password [email or web address]  This record has been created using Conservation officer, historic buildingsDragon voice recognition software. Errors have been sought and corrected,but may not always be located. Such creation errors do not reflect on the standard of care.

## 2019-10-15 NOTE — Evaluation (Signed)
Physical Therapy Evaluation Patient Details Name: Michael Ingram MRN: [id number] DOB: 1970-12-16 Today's Date: 10/15/2019   History of Present Illness  49 yo male admitted after sensation of dysphagia post prandially, in ED noted to have upper airway edema and intubated 1/19, extubated 1/23. PMH includes HTN, active user of high volume Etoh  Clinical Impression  Pt 49 yo male admitted for above. Pt received sitting up in bed with spouse present and agreeable to PT. Pt reports living with his wife and their daughters who are able to assist. Pt reports his bedroom/bathroom are upstairs however there is bedroom and bath on main level if he needs it. Pt reports 3 STE with bil HR and previously independent with all ADLs/IADLs. Pt mod I to get EOB and able to don shoes while EOB without LOB. Pt min guard assist for safety and line mgt with transfers and gait. Pt initially ambulating with one handheld assist due to feeling mildly unsteady and weak and progressing to ambulating without AD. Pts strength grossly 4/5. O2 sats monitored throughout with pt maintaining Spo2 97% or greater on RA. HR stable throughout session and BP stable post ambulation. Pt and wife reporting no concerns over returning home and pt reports feeling like he can manage all ADLs. Pt presents with mildly dec strength,balance and activity tolerance and PT will follow acutely. Pt does not need further PT services following hospital d/c.     Follow Up Recommendations No PT follow up    Equipment Recommendations  None recommended by PT    Recommendations for Other Services       Precautions / Restrictions Precautions Precautions: None Restrictions Weight Bearing Restrictions: No      Mobility  Bed Mobility Overal bed mobility: Modified Independent             General bed mobility comments: mod I to get EOB, no physical assist or cuing required  Transfers Overall transfer level: Needs assistance Equipment used:  None Transfers: Sit to/from Stand Sit to Stand: Min guard         General transfer comment: min guard for safety, pt steady with transfer  Ambulation/Gait Ambulation/Gait assistance: Min guard Gait Distance (Feet): 165 Feet Assistive device: None;1 person hand held assist Gait Pattern/deviations: Step-through pattern;Decreased stride length Gait velocity: decreased   General Gait Details: pt initially ambulating with light hand held assist from therapist due to feeling a little unsteady and weak, progressing to improved gait pattern with no AD or external assist, no LOB noted  Stairs            Wheelchair Mobility    Modified Rankin (Stroke Patients Only)       Balance Overall balance assessment: Mild deficits observed, not formally tested(steady EOB and able to don tennis shoes ind, ambulatory without AD)                                           Pertinent Vitals/Pain Pain Assessment: No/denies pain    Home Living Family/patient expects to be discharged to:: Private residence Living Arrangements: Spouse/significant other;Children Available Help at Discharge: Family;Available 24 hours/day Type of Home: House Home Access: Stairs to enter Entrance Stairs-Rails: Can reach both Entrance Stairs-Number of Steps: 3 Home Layout: Multi-level;Able to live on main level with bedroom/bathroom;Bed/bath upstairs Home Equipment: None      Prior Function Level of Independence: Independent  Hand Dominance        Extremity/Trunk Assessment   Upper Extremity Assessment Upper Extremity Assessment: Overall WFL for tasks assessed    Lower Extremity Assessment Lower Extremity Assessment: Overall WFL for tasks assessed    Cervical / Trunk Assessment Cervical / Trunk Assessment: Normal  Communication   Communication: No difficulties  Cognition Arousal/Alertness: Awake/alert Behavior During Therapy: WFL for tasks  assessed/performed Overall Cognitive Status: Within Functional Limits for tasks assessed                                        General Comments      Exercises     Assessment/Plan    PT Assessment Patient needs continued PT services  PT Problem List Decreased strength;Decreased mobility;Decreased range of motion;Decreased activity tolerance;Decreased balance;Decreased knowledge of use of DME;Cardiopulmonary status limiting activity       PT Treatment Interventions DME instruction;Therapeutic exercise;Gait training;Balance training;Stair training;Neuromuscular re-education;Functional mobility training;Therapeutic activities;Patient/family education    PT Goals (Current goals can be found in the Care Plan section)  Acute Rehab PT Goals Patient Stated Goal: get home PT Goal Formulation: With patient Time For Goal Achievement: 10/29/19 Potential to Achieve Goals: Good    Frequency Min 2X/week   Barriers to discharge        Co-evaluation               AM-PAC PT "6 Clicks" Mobility  Outcome Measure Help needed turning from your back to your side while in a flat bed without using bedrails?: None Help needed moving from lying on your back to sitting on the side of a flat bed without using bedrails?: None Help needed moving to and from a bed to a chair (including a wheelchair)?: A Little Help needed standing up from a chair using your arms (e.g., wheelchair or bedside chair)?: A Little Help needed to walk in hospital room?: A Little Help needed climbing 3-5 steps with a railing? : A Little 6 Click Score: 20    End of Session Equipment Utilized During Treatment: Gait belt Activity Tolerance: Patient tolerated treatment well Patient left: in chair;with nursing/sitter in room;with call bell/phone within reach Nurse Communication: Mobility status PT Visit Diagnosis: Unsteadiness on feet (R26.81);Other abnormalities of gait and mobility (R26.89)    Time:  2423-5361 PT Time Calculation (min) (ACUTE ONLY): 18 min   Charges:   PT Evaluation $PT Eval Moderate Complexity: 1 Mod         Michael Ingram PT, DPT 3:40 PM,10/15/19 [phone]   Michael Ingram 10/15/2019, 3:34 PM

## 2019-10-15 NOTE — TOC Initial Note (Signed)
Transition of Care [facility]) - Initial/Assessment Note    Patient Details  Name: Michael Ingram MRN: [id number] Date of Birth: 1971-06-18  Transition of Care  [facility]) CM/SW Contact:    Trenton Founds, RN Phone Number: 10/15/2019, 1:47 PM  Clinical Narrative:    RNCM assessed patient at bedside, patient alert and talking on phone on arrival to room. Patient reports to feeling some better and reports he is ready to get up and get to moving around some more. Per request of patient he was left with resources for substance abuse treatment. Patient reports that he lives in [place] now but was recently down towards [place] which is where his PCP is. Patient reports that he does still drive and is typically independent. Denies any other needs at this time. RNCM will continue to follow for any further concerns.               Expected Discharge Plan: Home/Self Care Barriers to Discharge: Other (comment)   Patient Goals and CMS Choice     Choice offered to / list presented to : Patient  Expected Discharge Plan and Services Expected Discharge Plan: Home/Self Care   Discharge Planning Services: CM Consult   Living arrangements for the past 2 months: Single Family Home Expected Discharge Date: 10/15/19                                    Prior Living Arrangements/Services Living arrangements for the past 2 months: Single Family Home Lives with:: Spouse Patient language and need for interpreter reviewed:: Yes Do you feel safe going back to the place where you live?: Yes      Need for Family Participation in Patient Care: Yes (Comment) Care giver support system in place?: Yes (comment)   Criminal Activity/Legal Involvement Pertinent to Current Situation/Hospitalization: No - Comment as needed  Activities of Daily Living      Permission Sought/Granted                  Emotional Assessment Appearance:: Appears stated age, Well-Groomed Attitude/Demeanor/Rapport:  Engaged Affect (typically observed): Appropriate Orientation: : Oriented to Self, Oriented to Place, Oriented to  Time, Oriented to Situation Alcohol / Substance Use: Alcohol Use, Illicit Drugs Psych Involvement: No (comment)  Admission diagnosis:  Adverse effect of angiotensin-converting enzyme inhibitor, initial encounter [T46.4X5A] Angioedema, initial encounter [T78.3XXA] Angioedema [T78.3XXA] Acute respiratory failure (HCC) [J96.00] Patient Active Problem List   Diagnosis Date Noted  . Angioedema 10/09/2019  . Acute respiratory failure (HCC) 10/09/2019   PCP:  System, Pcp Not In Pharmacy:   [facility] [address] ([address]), [place] - [address] [place]) [place] [address] Phone: [phone] Fax: [phone]     Social Determinants of Health (SDOH) Interventions    Readmission Risk Interventions No flowsheet data found.

## 2019-10-15 NOTE — Progress Notes (Signed)
CH visited pt. while rounding in ICU and as follow up from previous [facility] visits.  Pt. awake and preparing for discharge today.  Wife Lynnea Ferrier) @ bedside.  Both pt. and wife seemed to be in good spirits; grateful for care received from medical team and attention of unit chaplains over the last few days.  No further needs expressed at this time.      10/15/19 1200  Clinical Encounter Type  Visited With Patient and family together  Visit Type Follow-up;Psychological support;Social support;Critical Care

## 2020-06-17 ENCOUNTER — Emergency Department: Payer: Self-pay

## 2020-06-17 ENCOUNTER — Other Ambulatory Visit: Payer: Self-pay

## 2020-06-17 ENCOUNTER — Emergency Department
Admission: EM | Admit: 2020-06-17 | Discharge: 2020-06-17 | Disposition: A | Discharge status: Home / Self Care | Payer: Self-pay | Attending: Emergency Medicine | Admitting: Emergency Medicine

## 2020-06-17 DIAGNOSIS — Z79899 Other long term (current) drug therapy: Secondary | ICD-10-CM | POA: Insufficient documentation

## 2020-06-17 DIAGNOSIS — M545 Low back pain, unspecified: Secondary | ICD-10-CM

## 2020-06-17 DIAGNOSIS — I10 Essential (primary) hypertension: Secondary | ICD-10-CM | POA: Insufficient documentation

## 2020-06-17 DIAGNOSIS — F17210 Nicotine dependence, cigarettes, uncomplicated: Secondary | ICD-10-CM | POA: Insufficient documentation

## 2020-06-17 MED ORDER — ORPHENADRINE CITRATE 30 MG/ML IJ SOLN
60.0000 mg | Freq: Two times a day (BID) | INTRAMUSCULAR | Status: DC
  Administered 2020-06-17: 60 mg via INTRAMUSCULAR
  Filled 2020-06-17: qty 2

## 2020-06-17 MED ORDER — CYCLOBENZAPRINE HCL 10 MG PO TABS: 10.0000 mg | ORAL_TABLET | Freq: Three times a day (TID) | ORAL | 0 refills | Status: DC | PRN

## 2020-06-17 MED ORDER — TRAMADOL HCL 50 MG PO TABS: 50.0000 mg | ORAL_TABLET | Freq: Four times a day (QID) | ORAL | 0 refills | Status: AC | PRN

## 2020-06-17 MED ORDER — IBUPROFEN 600 MG PO TABS: 600.0000 mg | ORAL_TABLET | Freq: Three times a day (TID) | ORAL | 0 refills | Status: DC | PRN

## 2020-06-17 MED ORDER — HYDROMORPHONE HCL 1 MG/ML IJ SOLN
1.0000 mg | Freq: Once | INTRAMUSCULAR | Status: AC
  Administered 2020-06-17: 1 mg via INTRAMUSCULAR
  Filled 2020-06-17: qty 1

## 2020-06-17 NOTE — Discharge Instructions (Signed)
Discharge care instruction take medication as directed. 

## 2020-06-17 NOTE — ED Notes (Signed)
Pt ambulatory to restroom with steady gait noted  

## 2020-06-17 NOTE — ED Provider Notes (Signed)
[facility] Emergency Department Provider Note   ____________________________________________   First MD Initiated Contact with Patient 06/17/20 1228     (approximate)  I have reviewed the triage vital signs and the nursing notes.   HISTORY  Chief Complaint Back Pain    HPI Michael Ingram is a 49 y.o. male patient complain increasing back pain for the last 4 days.  Patient state onset of complaint was a lifting bending incident.  Patient denies radicular component to his back pain.  Patient has bladder bowel dysfunction.  Patient stated he has issue of episodic back pain.  Patient has been symptom-free for over a year.  Patient rates the pain as a 7/10.  Pain described pain is "achy".  No palliative measures for complaint.         Past Medical History:  Diagnosis Date  . Hypertension     Patient Active Problem List   Diagnosis Date Noted  . Adverse reaction to ACE-I (angiotensin-converting enzyme inhibitor)   . Essential hypertension   . Angioedema 10/09/2019  . Acute respiratory failure (HCC) 10/09/2019    Past Surgical History:  Procedure Laterality Date  . INTUBATION-ENDOTRACHEAL WITH TRACHEOSTOMY STANDBY N/A 10/09/2019   Procedure: INTUBATION-ENDOTRACHEAL WITH TRACHEOSTOMY STANDBY;  Surgeon: Linus Salmons, MD;  Location: [facility] ORS;  Service: ENT;  Laterality: N/A;    Prior to Admission medications   Medication Sig Start Date End Date Taking? Authorizing Provider  amLODipine (NORVASC) 5 MG tablet Take 1 tablet (5 mg total) by mouth daily. 10/16/19   Arnetha Courser, MD  cyclobenzaprine (FLEXERIL) 10 MG tablet Take 1 tablet (10 mg total) by mouth 3 (three) times daily as needed. 06/17/20   Joni Reining, PA-C  ibuprofen (ADVIL) 600 MG tablet Take 1 tablet (600 mg total) by mouth every 8 (eight) hours as needed. 06/17/20   Joni Reining, PA-C  nicotine (NICODERM CQ - DOSED IN MG/24 HOURS) 14 mg/24hr patch Place 1 patch (14 mg total) onto  the skin daily. 10/16/19   Arnetha Courser, MD  thiamine 100 MG tablet Take 1 tablet (100 mg total) by mouth daily. 10/16/19   Arnetha Courser, MD  traMADol (ULTRAM) 50 MG tablet Take 1 tablet (50 mg total) by mouth every 6 (six) hours as needed. 06/17/20 06/17/21  Joni Reining, PA-C    Allergies Lisinopril  No family history on file.  Social History Social History   Tobacco Use  . Smoking status: Current Every Day Smoker    Packs/day: 0.50    Types: Cigarettes  . Smokeless tobacco: Never Used  Vaping Use  . Vaping Use: Never used  Substance Use Topics  . Alcohol use: Yes  . Drug use: Yes    Types: Marijuana    Review of Systems Constitutional: No fever/chills Eyes: No visual changes. ENT: No sore throat. Cardiovascular: Denies chest pain. Respiratory: Denies shortness of breath. Gastrointestinal: No abdominal pain.  No nausea, no vomiting.  No diarrhea.  No constipation. Genitourinary: Negative for dysuria. Musculoskeletal: Positive for back pain. Skin: Negative for rash. Neurological: Negative for headaches, focal weakness or numbness. Endocrine:  Hypertension. Allergic/Immunilogical: Lisinopril ____________________________________________   PHYSICAL EXAM:  VITAL SIGNS: ED Triage Vitals  Enc Vitals Group     BP 06/17/20 1156 (!) 140/98     Pulse Rate 06/17/20 1156 96     Resp 06/17/20 1156 17     Temp 06/17/20 1156 98.4 F (36.9 C)     Temp Source 06/17/20 1156 Oral  SpO2 06/17/20 1156 100 %     Weight 06/17/20 1157 170 lb (77.1 kg)     Height 06/17/20 1157 5\' 11"  (1.803 m)     Head Circumference --      Peak Flow --      Pain Score 06/17/20 1157 7     Pain Loc --      Pain Edu? --      Excl. in GC? --    Constitutional: Alert and oriented. Well appearing and in no acute distress. Neck: No cervical spine tenderness to palpation. Cardiovascular: Normal rate, regular rhythm. Grossly normal heart sounds.  Good peripheral circulation. Respiratory: Normal  respiratory effort.  No retractions. Lungs CTAB. Gastrointestinal: Soft and nontender. No distention. No abdominal bruits. No CVA tenderness. Genitourinary: Deferred Musculoskeletal: No obvious deformity to the lumbar spine.  Patient is moderate guarding palpation L4-S1.  Patient decreased range of motion soft feels.  Patient is negative straight leg test in the supine position.. Neurologic:  Normal speech and language. No gross focal neurologic deficits are appreciated. No gait instability. Skin:  Skin is warm, dry and intact. No rash noted. Psychiatric: Mood and affect are normal. Speech and behavior are normal.  ____________________________________________   LABS (all labs ordered are listed, but only abnormal results are displayed)  Labs Reviewed - No data to display ____________________________________________  EKG   ____________________________________________  RADIOLOGY  ED MD interpretation:   No acute findings on x-ray of the lumbar spine. Official radiology report(s): DG Lumbar Spine 2-3 Views  Result Date: 06/17/2020 CLINICAL DATA:  Low back pain EXAM: LUMBAR SPINE - 2-3 VIEW COMPARISON:  None. FINDINGS: Frontal, lateral, and spot lumbosacral lateral images were obtained. There are 5 nonrib-bearing lumbar type vertebral bodies. There is no fracture or spondylolisthesis. The disc spaces appear normal. Anterior osteophyte along the anterior aspect of S1 noted. No erosive change. There is aortic atherosclerosis. IMPRESSION: No fracture or spondylolisthesis.  No disc space narrowing. Aortic Atherosclerosis (ICD10-I70.0). Electronically Signed   By: 06/19/2020 III M.D.   On: 06/17/2020 14:04    ____________________________________________   PROCEDURES  Procedure(s) performed (including Critical Care):  Procedures   ____________________________________________   INITIAL IMPRESSION / ASSESSMENT AND PLAN / ED COURSE  As part of my medical decision making, I  reviewed the following data within the electronic MEDICAL RECORD NUMBER         Patient presents with 4 days of increasing low back pain secondary to lifting bending incident.  Discussed no acute findings on x-ray with patient.  Patient currently physical exam is consistent with musculoskeletal low back pain.  Patient given discharge care instruction prescription for tramadol, ibuprofen, Flexeril.  Patient advised [facility]  Return to ED if condition worsens.     ____________________________________________   FINAL CLINICAL IMPRESSION(S) / ED DIAGNOSES  Final diagnoses:  Bilateral low back pain without sciatica, unspecified chronicity     ED Discharge Orders         Ordered    traMADol (ULTRAM) 50 MG tablet  Every 6 hours PRN        06/17/20 1420    cyclobenzaprine (FLEXERIL) 10 MG tablet  3 times daily PRN        06/17/20 1420    ibuprofen (ADVIL) 600 MG tablet  Every 8 hours PRN        06/17/20 1420          *Please note:  Michael Ingram was evaluated in Emergency Department  on 06/17/2020 for the symptoms described in the history of present illness. He was evaluated in the context of the global COVID-19 pandemic, which necessitated consideration that the patient might be at risk for infection with the SARS-CoV-2 virus that causes COVID-19. Institutional protocols and algorithms that pertain to the evaluation of patients at risk for COVID-19 are in a state of rapid change based on information released by regulatory bodies including the CDC and federal and state organizations. These policies and algorithms were followed during the patient's care in the ED.  Some ED evaluations and interventions may be delayed as a result of limited staffing during and the pandemic.*   Note:  This document was prepared using Dragon voice recognition software and may include unintentional dictation errors.    Joni Reining, PA-C 06/17/20 1423    Chesley Noon,  MD 06/19/20 [phone]

## 2020-06-17 NOTE — ED Triage Notes (Signed)
Pt c/o lower back pain since Friday, states he has a chronic issues with it and states"t went out on me. "

## 2020-09-22 ENCOUNTER — Other Ambulatory Visit: Payer: Self-pay

## 2020-09-22 ENCOUNTER — Emergency Department
Admission: EM | Admit: 2020-09-22 | Discharge: 2020-09-22 | Disposition: A | Discharge status: Home / Self Care | Payer: Self-pay | Attending: Student in an Organized Health Care Education/Training Program | Admitting: Student in an Organized Health Care Education/Training Program

## 2020-09-22 ENCOUNTER — Emergency Department: Payer: Self-pay

## 2020-09-22 DIAGNOSIS — Z20822 Contact with and (suspected) exposure to covid-19: Secondary | ICD-10-CM | POA: Insufficient documentation

## 2020-09-22 DIAGNOSIS — F10230 Alcohol dependence with withdrawal, uncomplicated: Secondary | ICD-10-CM | POA: Insufficient documentation

## 2020-09-22 DIAGNOSIS — R1310 Dysphagia, unspecified: Secondary | ICD-10-CM | POA: Insufficient documentation

## 2020-09-22 DIAGNOSIS — Z79899 Other long term (current) drug therapy: Secondary | ICD-10-CM | POA: Insufficient documentation

## 2020-09-22 DIAGNOSIS — F17210 Nicotine dependence, cigarettes, uncomplicated: Secondary | ICD-10-CM | POA: Insufficient documentation

## 2020-09-22 DIAGNOSIS — R6889 Other general symptoms and signs: Secondary | ICD-10-CM

## 2020-09-22 DIAGNOSIS — I10 Essential (primary) hypertension: Secondary | ICD-10-CM | POA: Insufficient documentation

## 2020-09-22 LAB — CBC WITH DIFFERENTIAL/PLATELET
Abs Immature Granulocytes: 0 10*3/uL (ref 0.00–0.07)
Basophils Absolute: 0.1 10*3/uL (ref 0.0–0.1)
Basophils Relative: 1 %
Eosinophils Absolute: 0.1 10*3/uL (ref 0.0–0.5)
Eosinophils Relative: 1 %
HCT: 33.3 % — ABNORMAL LOW (ref 39.0–52.0)
Hemoglobin: 11.8 g/dL — ABNORMAL LOW (ref 13.0–17.0)
Immature Granulocytes: 0 %
Lymphocytes Relative: 28 %
Lymphs Abs: 1.7 10*3/uL (ref 0.7–4.0)
MCH: 32.4 pg (ref 26.0–34.0)
MCHC: 35.4 g/dL (ref 30.0–36.0)
MCV: 91.5 fL (ref 80.0–100.0)
Monocytes Absolute: 0.9 10*3/uL (ref 0.1–1.0)
Monocytes Relative: 15 %
Neutro Abs: 3.3 10*3/uL (ref 1.7–7.7)
Neutrophils Relative %: 55 %
Platelets: 128 10*3/uL — ABNORMAL LOW (ref 150–400)
RBC: 3.64 MIL/uL — ABNORMAL LOW (ref 4.22–5.81)
RDW: 14.4 % (ref 11.5–15.5)
WBC: 6 10*3/uL (ref 4.0–10.5)
nRBC: 0 % (ref 0.0–0.2)

## 2020-09-22 LAB — COMPREHENSIVE METABOLIC PANEL
ALT: 48 U/L — ABNORMAL HIGH (ref 0–44)
AST: 61 U/L — ABNORMAL HIGH (ref 15–41)
Albumin: 4.3 g/dL (ref 3.5–5.0)
Alkaline Phosphatase: 88 U/L (ref 38–126)
Anion gap: 11 (ref 5–15)
BUN: 14 mg/dL (ref 6–20)
CO2: 22 mmol/L (ref 22–32)
Calcium: 9.2 mg/dL (ref 8.9–10.3)
Chloride: 102 mmol/L (ref 98–111)
Creatinine, Ser: 0.65 mg/dL (ref 0.61–1.24)
GFR, Estimated: >60 mL/min (ref >60)
Glucose, Bld: 94 mg/dL (ref 70–99)
Potassium: 3.9 mmol/L (ref 3.5–5.1)
Sodium: 135 mmol/L (ref 135–145)
Total Bilirubin: 0.9 mg/dL (ref 0.3–1.2)
Total Protein: 7.6 g/dL (ref 6.5–8.1)

## 2020-09-22 LAB — SARS CORONAVIRUS 2 (TAT 6-24 HRS): SARS Coronavirus 2: NEGATIVE

## 2020-09-22 LAB — PROTIME-INR
INR: 0.9 (ref 0.8–1.2)
Prothrombin Time: 12.1 seconds (ref 11.4–15.2)

## 2020-09-22 MED ORDER — CHLORDIAZEPOXIDE HCL 25 MG PO CAPS
25.0000 mg | ORAL_CAPSULE | Freq: Once | ORAL | Status: AC
  Administered 2020-09-22: 25 mg via ORAL
  Filled 2020-09-22: qty 1

## 2020-09-22 MED ORDER — LORAZEPAM 0.5 MG PO TABS
0.5000 mg | ORAL_TABLET | Freq: Once | ORAL | Status: AC
  Administered 2020-09-22: 0.5 mg via ORAL
  Filled 2020-09-22: qty 1

## 2020-09-22 MED ORDER — CHLORDIAZEPOXIDE HCL 25 MG PO CAPS: 25.0000 mg | ORAL_CAPSULE | ORAL | 0 refills | Status: DC

## 2020-09-22 MED ORDER — AMLODIPINE BESYLATE 5 MG PO TABS
5.0000 mg | ORAL_TABLET | Freq: Once | ORAL | Status: AC
  Administered 2020-09-22: 5 mg via ORAL
  Filled 2020-09-22: qty 1

## 2020-09-22 MED ORDER — AMLODIPINE BESYLATE 5 MG PO TABS: 5.0000 mg | ORAL_TABLET | Freq: Every day | ORAL | 1 refills | Status: DC

## 2020-09-22 NOTE — ED Notes (Signed)
PT ambulatory to room 11h with steady gait.  NAD noted.  Dr. Roxan Hockey to bedside to assess pt.

## 2020-09-22 NOTE — ED Provider Notes (Addendum)
[facility] Emergency Department Provider Note    Event Date/Time   First MD Initiated Contact with Patient 09/22/20 [phone]     (approximate)  I have reviewed the triage vital signs and the nursing notes.   HISTORY  Chief Complaint Dysphagia    HPI Michael Ingram is a 50 y.o. male below listed past medical history with history of angioedema requiring OR intubation for 1 year prior not currently on lisinopril presents to the ER for evaluation of difficulty swallowing and feeling like phlegm was getting stuck in his throat.  This started around 10:00 after he was eating a bowl of cereal.  He denies any chest pain or pressure.  States he was drinking heavily over the weekend.  Denies any pain at this time.  Denies any numbness or tingling.  States his last drink was over 24 hours ago.  States that he is able to pass liquids.  Does not feel he has had any change in phonation or his voice.  States that this feels different than when he had the swelling and angioedema.    Past Medical History:  Diagnosis Date  . Hypertension    No family history on file. Past Surgical History:  Procedure Laterality Date  . INTUBATION-ENDOTRACHEAL WITH TRACHEOSTOMY STANDBY N/A 10/09/2019   Procedure: INTUBATION-ENDOTRACHEAL WITH TRACHEOSTOMY STANDBY;  Surgeon: Beverly Gust, MD;  Location: [facility] ORS;  Service: ENT;  Laterality: N/A;   Patient Active Problem List   Diagnosis Date Noted  . Adverse reaction to ACE-I (angiotensin-converting enzyme inhibitor)   . Essential hypertension   . Angioedema 10/09/2019  . Acute respiratory failure ([place]) 10/09/2019      Prior to Admission medications   Medication Sig Start Date End Date Taking? Authorizing Provider  chlordiazePOXIDE (LIBRIUM) 25 MG capsule Take 1 capsule (25 mg total) by mouth as directed. 09/22/20  Yes Merlyn Lot, MD  amLODipine (NORVASC) 5 MG tablet Take 1 tablet (5 mg total) by mouth daily. 09/22/20    Merlyn Lot, MD  cyclobenzaprine (FLEXERIL) 10 MG tablet Take 1 tablet (10 mg total) by mouth 3 (three) times daily as needed. 06/17/20   Sable Feil, PA-C  ibuprofen (ADVIL) 600 MG tablet Take 1 tablet (600 mg total) by mouth every 8 (eight) hours as needed. 06/17/20   Sable Feil, PA-C  nicotine (NICODERM CQ - DOSED IN MG/24 HOURS) 14 mg/24hr patch Place 1 patch (14 mg total) onto the skin daily. 10/16/19   Lorella Nimrod, MD  thiamine 100 MG tablet Take 1 tablet (100 mg total) by mouth daily. 10/16/19   Lorella Nimrod, MD  traMADol (ULTRAM) 50 MG tablet Take 1 tablet (50 mg total) by mouth every 6 (six) hours as needed. 06/17/20 06/17/21  Sable Feil, PA-C    Allergies Lisinopril    Social History Social History   Tobacco Use  . Smoking status: Current Every Day Smoker    Packs/day: 0.50    Types: Cigarettes  . Smokeless tobacco: Never Used  Vaping Use  . Vaping Use: Never used  Substance Use Topics  . Alcohol use: Yes  . Drug use: Yes    Types: Marijuana    Review of Systems Patient denies headaches, rhinorrhea, blurry vision, numbness, shortness of breath, chest pain, edema, cough, abdominal pain, nausea, vomiting, diarrhea, dysuria, fevers, rashes or hallucinations unless otherwise stated above in HPI. ____________________________________________   PHYSICAL EXAM:  VITAL SIGNS: Vitals:   09/22/20 0818 09/22/20 0955  BP: (!) 174/92 Marland Kitchen)  164/97  Pulse: 92 77  Resp: 18 18  Temp:    SpO2: 100% 100%    Constitutional: Alert and oriented.  Eyes: Conjunctivae are normal.  Head: Atraumatic. Nose: No congestion/rhinnorhea. Mouth/Throat: Mucous membranes are moist.  No edema or erythema Neck: No stridor. Painless ROM.  Cardiovascular: Normal rate, regular rhythm. Grossly normal heart sounds.  Good peripheral circulation. Respiratory: Normal respiratory effort.  No retractions. Lungs CTAB. Gastrointestinal: Soft and nontender. No distention. No abdominal  bruits. No CVA tenderness. Genitourinary:  Musculoskeletal: No lower extremity tenderness nor edema.  No joint effusions. Neurologic:  + tongue fasciculations, mild tremor of Bilat hands. Normal speech and language. CNI intact.  No dysmetria or hypertonicity.  No gross focal neurologic deficits are appreciated. No facial droop Skin:  Skin is warm, dry and intact. No rash noted. Psychiatric: Mood and affect are normal. Speech and behavior are normal.  ____________________________________________   LABS (all labs ordered are listed, but only abnormal results are displayed)  Results for orders placed or performed during the hospital encounter of 09/22/20 (from the past 24 hour(s))  CBC with Differential     Status: Abnormal   Collection Time: 09/22/20  8:10 AM  Result Value Ref Range   WBC 6.0 4.0 - 10.5 K/uL   RBC 3.64 (L) 4.22 - 5.81 MIL/uL   Hemoglobin 11.8 (L) 13.0 - 17.0 g/dL   HCT 83.6 (L) 62.9 - 47.6 %   MCV 91.5 80.0 - 100.0 fL   MCH 32.4 26.0 - 34.0 pg   MCHC 35.4 30.0 - 36.0 g/dL   RDW 54.6 50.3 - 54.6 %   Platelets 128 (L) 150 - 400 K/uL   nRBC 0.0 0.0 - 0.2 %   Neutrophils Relative % 55 %   Neutro Abs 3.3 1.7 - 7.7 K/uL   Lymphocytes Relative 28 %   Lymphs Abs 1.7 0.7 - 4.0 K/uL   Monocytes Relative 15 %   Monocytes Absolute 0.9 0.1 - 1.0 K/uL   Eosinophils Relative 1 %   Eosinophils Absolute 0.1 0.0 - 0.5 K/uL   Basophils Relative 1 %   Basophils Absolute 0.1 0.0 - 0.1 K/uL   Immature Granulocytes 0 %   Abs Immature Granulocytes 0.00 0.00 - 0.07 K/uL  Comprehensive metabolic panel     Status: Abnormal   Collection Time: 09/22/20  8:10 AM  Result Value Ref Range   Sodium 135 135 - 145 mmol/L   Potassium 3.9 3.5 - 5.1 mmol/L   Chloride 102 98 - 111 mmol/L   CO2 22 22 - 32 mmol/L   Glucose, Bld 94 70 - 99 mg/dL   BUN 14 6 - 20 mg/dL   Creatinine, Ser 5.68 0.61 - 1.24 mg/dL   Calcium 9.2 8.9 - 12.7 mg/dL   Total Protein 7.6 6.5 - 8.1 g/dL   Albumin 4.3 3.5 -  5.0 g/dL   AST 61 (H) 15 - 41 U/L   ALT 48 (H) 0 - 44 U/L   Alkaline Phosphatase 88 38 - 126 U/L   Total Bilirubin 0.9 0.3 - 1.2 mg/dL   GFR, Estimated >51 >70 mL/min   Anion gap 11 5 - 15  Protime-INR     Status: None   Collection Time: 09/22/20  8:10 AM  Result Value Ref Range   Prothrombin Time 12.1 11.4 - 15.2 seconds   INR 0.9 0.8 - 1.2   ____________________________________________ ____________________________________________  RADIOLOGY  I personally reviewed all radiographic images ordered to evaluate for the above  acute complaints and reviewed radiology reports and findings.  These findings were personally discussed with the patient.  Please see medical record for radiology report.  ____________________________________________   PROCEDURES  Procedure(s) performed:  Procedures    Critical Care performed: no ____________________________________________   INITIAL IMPRESSION / ASSESSMENT AND PLAN / ED COURSE  Pertinent labs & imaging results that were available during my care of the patient were reviewed by me and considered in my medical decision making (see chart for details).   DDX: Esophagitis, mass, angioedema, URI,  Eulan L Rosenzweig is a 50 y.o. who presents to the ED with presentation as described above.  He is afebrile hemodynamically stable and nontoxic-appearing.  Not consistent with angioedema.  No oropharyngeal mass.  No stridor.  Imaging is reassuring blood work is also reassuring.  Does not seem consistent with CVA or neuro deficit.  Symptoms seem to be isolated to issue with increased phlegm and some congestion.  Will test for Covid.  He is not having trouble swallowing liquids or solids.  He does appear slightly tremulous and does have long history of alcohol dependence.  I gave him Ativan as well as Librium.  His tremors are mild.  Patient is interested in getting help with detox.  I will order Librium taper.  I have asked TTS to bring him resources for  alcohol detox.  Patient does not want inpatient detox and prefer day treatment which I think is reasonable.  Have discussed with the patient and available family all diagnostics and treatments performed thus far and all questions were answered to the best of my ability. The patient demonstrates understanding and agreement with plan.      The patient was evaluated in Emergency Department today for the symptoms described in the history of present illness. He/she was evaluated in the context of the global COVID-19 pandemic, which necessitated consideration that the patient might be at risk for infection with the SARS-CoV-2 virus that causes COVID-19. Institutional protocols and algorithms that pertain to the evaluation of patients at risk for COVID-19 are in a state of rapid change based on information released by regulatory bodies including the CDC and federal and state organizations. These policies and algorithms were followed during the patient's care in the ED.  As part of my medical decision making, I reviewed the following data within the electronic MEDICAL RECORD NUMBER Nursing notes reviewed and incorporated, Labs reviewed, notes from prior ED visits and [place] Controlled Substance Database   ____________________________________________   FINAL CLINICAL IMPRESSION(S) / ED DIAGNOSES  Final diagnoses:  Congestion of throat  Alcohol dependence with uncomplicated withdrawal (HCC)      NEW MEDICATIONS STARTED DURING THIS VISIT:  Discharge Medication List as of 09/22/2020  9:59 AM    START taking these medications   Details  chlordiazePOXIDE (LIBRIUM) 25 MG capsule Take 1 capsule (25 mg total) by mouth as directed., Starting Mon 09/22/2020, Normal         Note:  This document was prepared using Dragon voice recognition software and may include unintentional dictation errors.    Willy Eddy, MD 09/22/20 2637    Willy Eddy, MD 09/22/20 1016

## 2020-09-22 NOTE — ED Notes (Addendum)
No tongue or oral swelling on exam.

## 2020-09-22 NOTE — ED Triage Notes (Addendum)
Reports difficulty swallowing since last night, about 10:00PM. Pt states he is able to eat and drink but "it's not natural". States occured after eating bowl of cereal. Pt alert and oriented X4, cooperative, RR even and unlabored, color WNL. Pt in NAD. Able to speak in full and complete sentences without difficulty.

## 2020-09-23 ENCOUNTER — Ambulatory Visit: Payer: Self-pay | Admitting: *Deleted

## 2020-09-23 NOTE — Telephone Encounter (Signed)
C/o difficulty swallowing this am after eating a [employer] biscuit. Patient reports he was seen in ED last night for swallowing issues and swallowing got better. Now issues with swallowing are back with swallowing solid foods. Denies fever, pain, difficulty breathing, choking or aspiration, coughing after swallowing. Encouraged patient to take small bites or cut up solid foods in smaller pieces if difficulty swallowing persists until seen by PCP for further evaluation. Patient reports he was coughing a lot prior to ED visit. Denies difficulty swallowing pills. Encouraged patient to see PCP or get set up to see a PCP. AVS from ED visit reviewed with patient and new medication, Librium. Care advise given. Patient and  Wife verbalized understanding of care advise and to set up appt with a PCP and to call back or go to ED if symptoms worsen.   Reason for Disposition . [1] Swallowing difficulty AND [2] cause unknown (Exception: difficulty swallowing is a chronic symptom)  Answer Assessment - Initial Assessment Questions 1. SYMPTOM: "Are you having difficulty swallowing liquids, solids, or both?"     Difficulty swallowing solids 2. ONSET: "When did the swallowing problems begin?"      2 nights ago  3. CAUSE: "What do you think is causing the problem?"      Not sure  4. CHRONIC/RECURRENT: "Is this a new problem for you?"  If no, ask: "How long have you had this problem?" (e.g., days, weeks, months)      no 5. OTHER SYMPTOMS: "Do you have any other symptoms?" (e.g., difficulty breathing, sore throat, swollen tongue, chest pain)     none 6. PREGNANCY: "Is there any chance you are pregnant?" "When was your last menstrual period?"     Na  Protocols used: SWALLOWING DIFFICULTY-A-AH

## 2020-11-21 ENCOUNTER — Ambulatory Visit: Payer: Self-pay | Admitting: Family Medicine

## 2022-04-04 IMAGING — CR DG CHEST 2V
2 series · 2 of 2 positions shown · non-contrast
Comparison: October 12, 2019.

CLINICAL DATA: Dysphagia and congestion

EXAM:
CHEST - 2 VIEW

[chest pa]
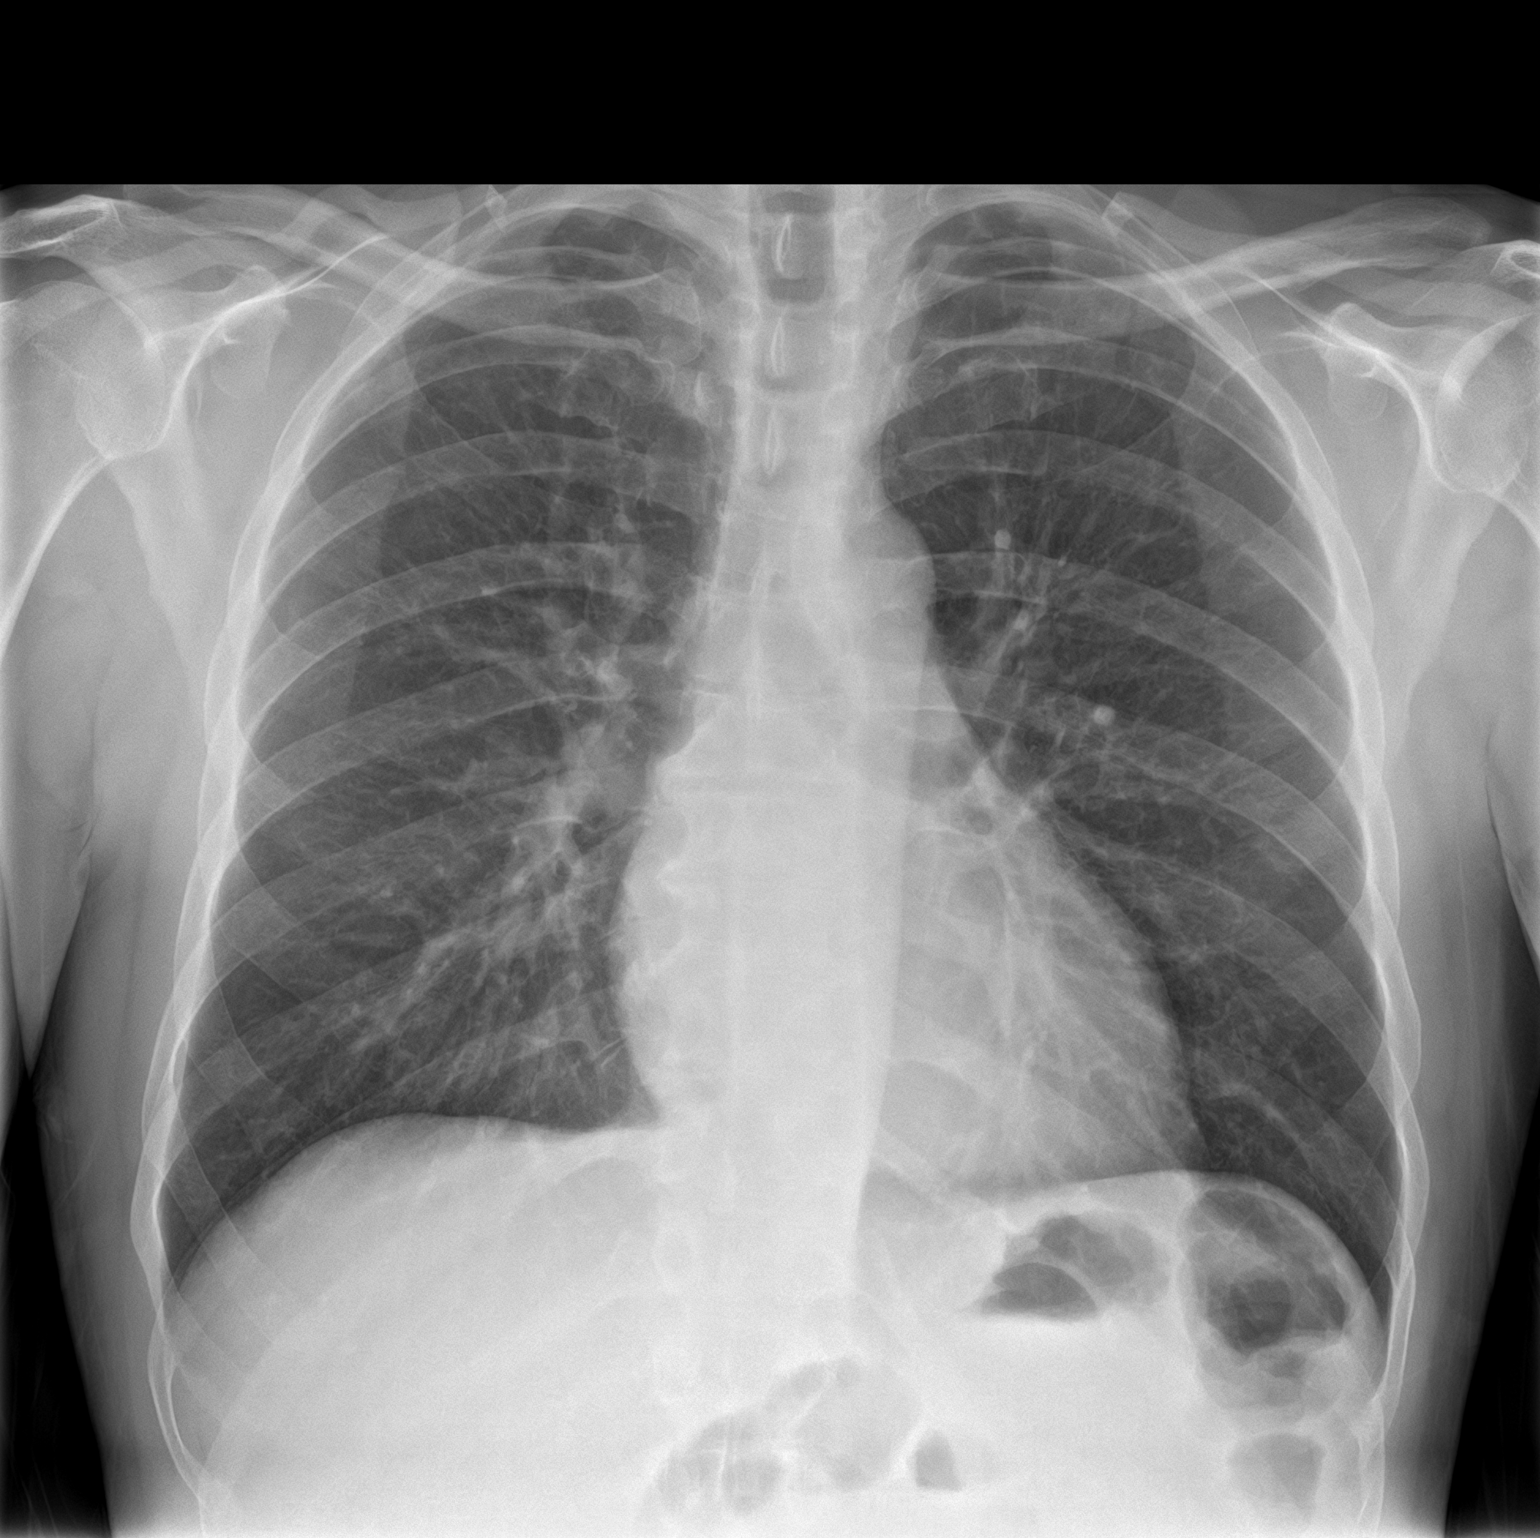

[chest lat]
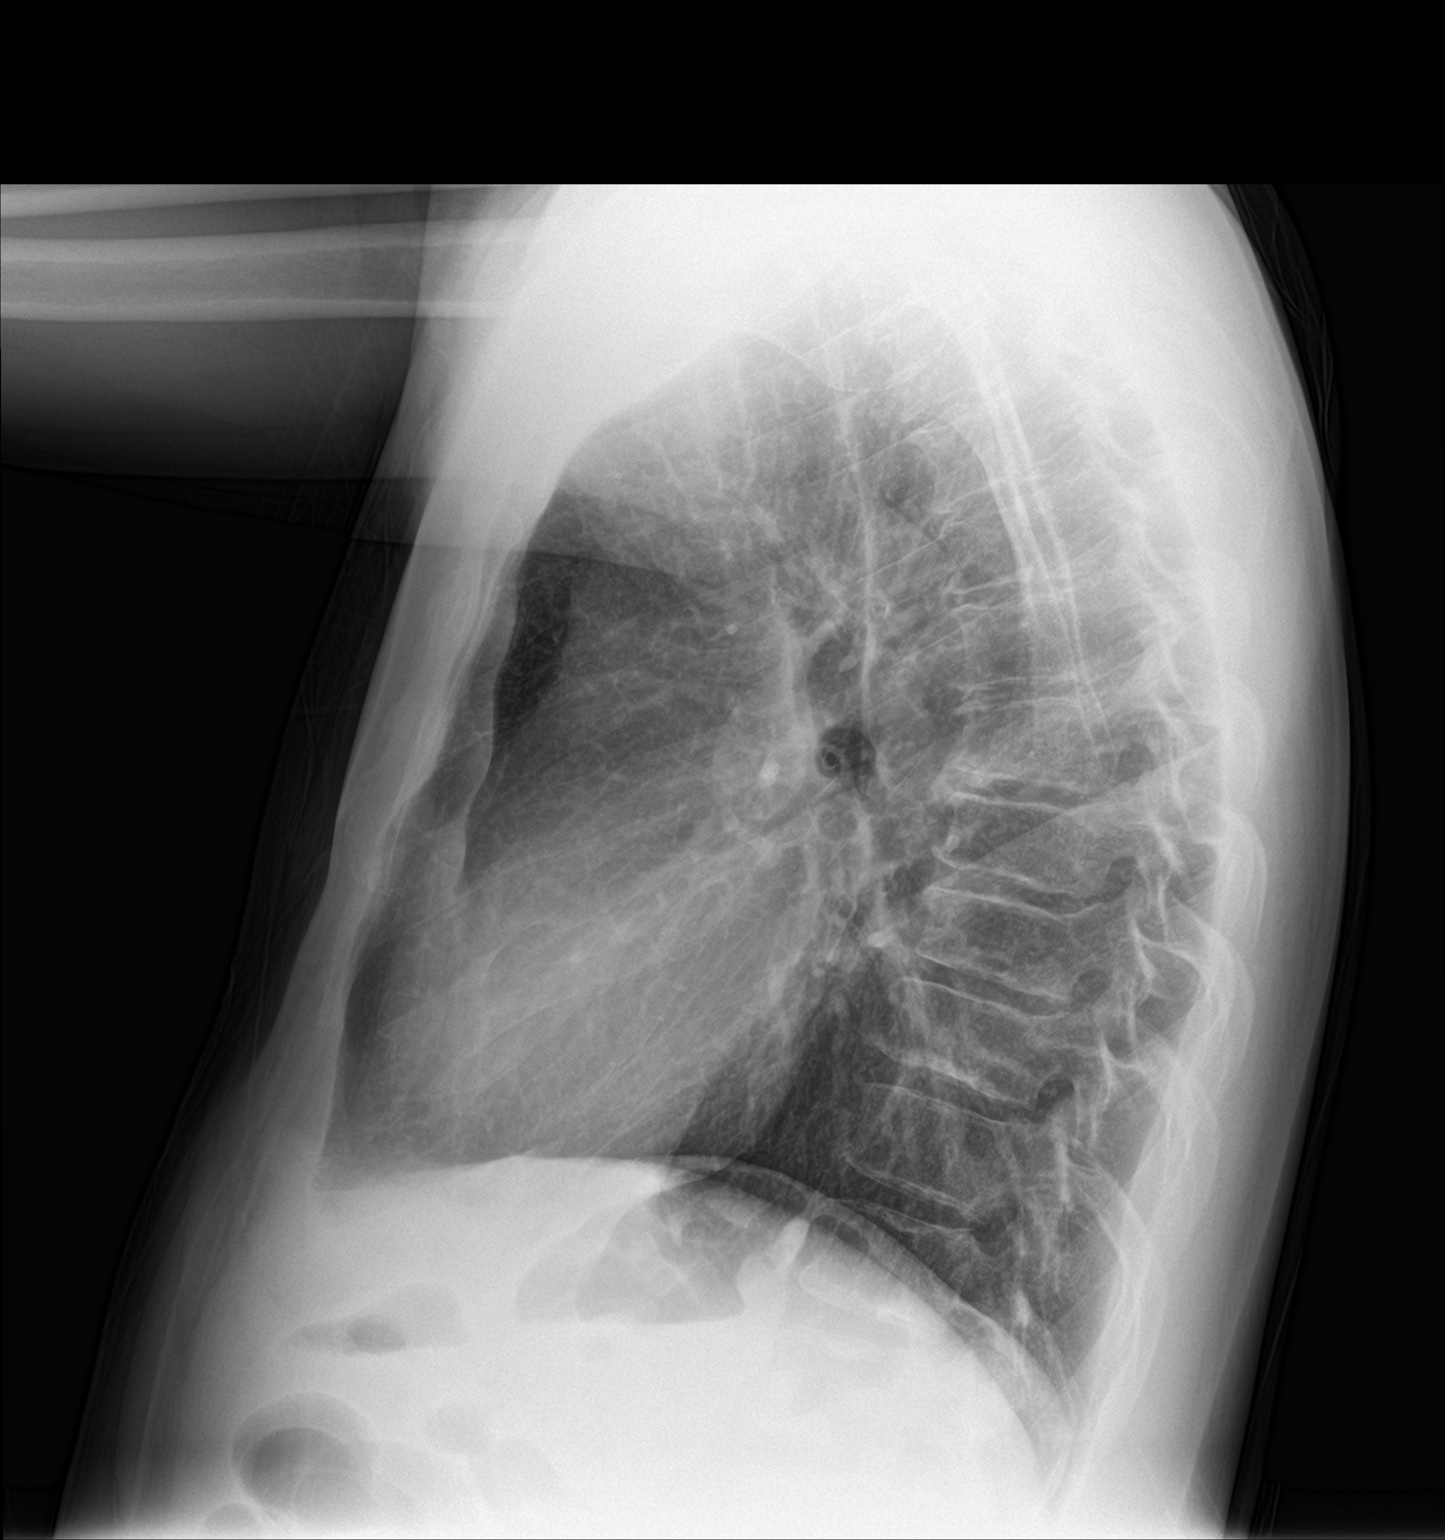

[2 of 2 positions shown; findings below may reference images not displayed]

FINDINGS: There is slight upper lobe scarring bilaterally. No edema or
airspace opacity. Heart size and pulmonary vascularity are normal.
No adenopathy. There is mild anterior wedging of midthoracic
vertebral bodies.
IMPRESSION: Areas of mild scarring in the upper lobes. No edema or airspace
opacity. Heart size normal.

## 2022-04-04 IMAGING — CR DG NECK SOFT TISSUE
2 series · 2 of 2 positions shown · non-contrast
Comparison: None.

CLINICAL DATA: Dysphagia

EXAM:
NECK SOFT TISSUES - 1+ VIEW

[neck lat]
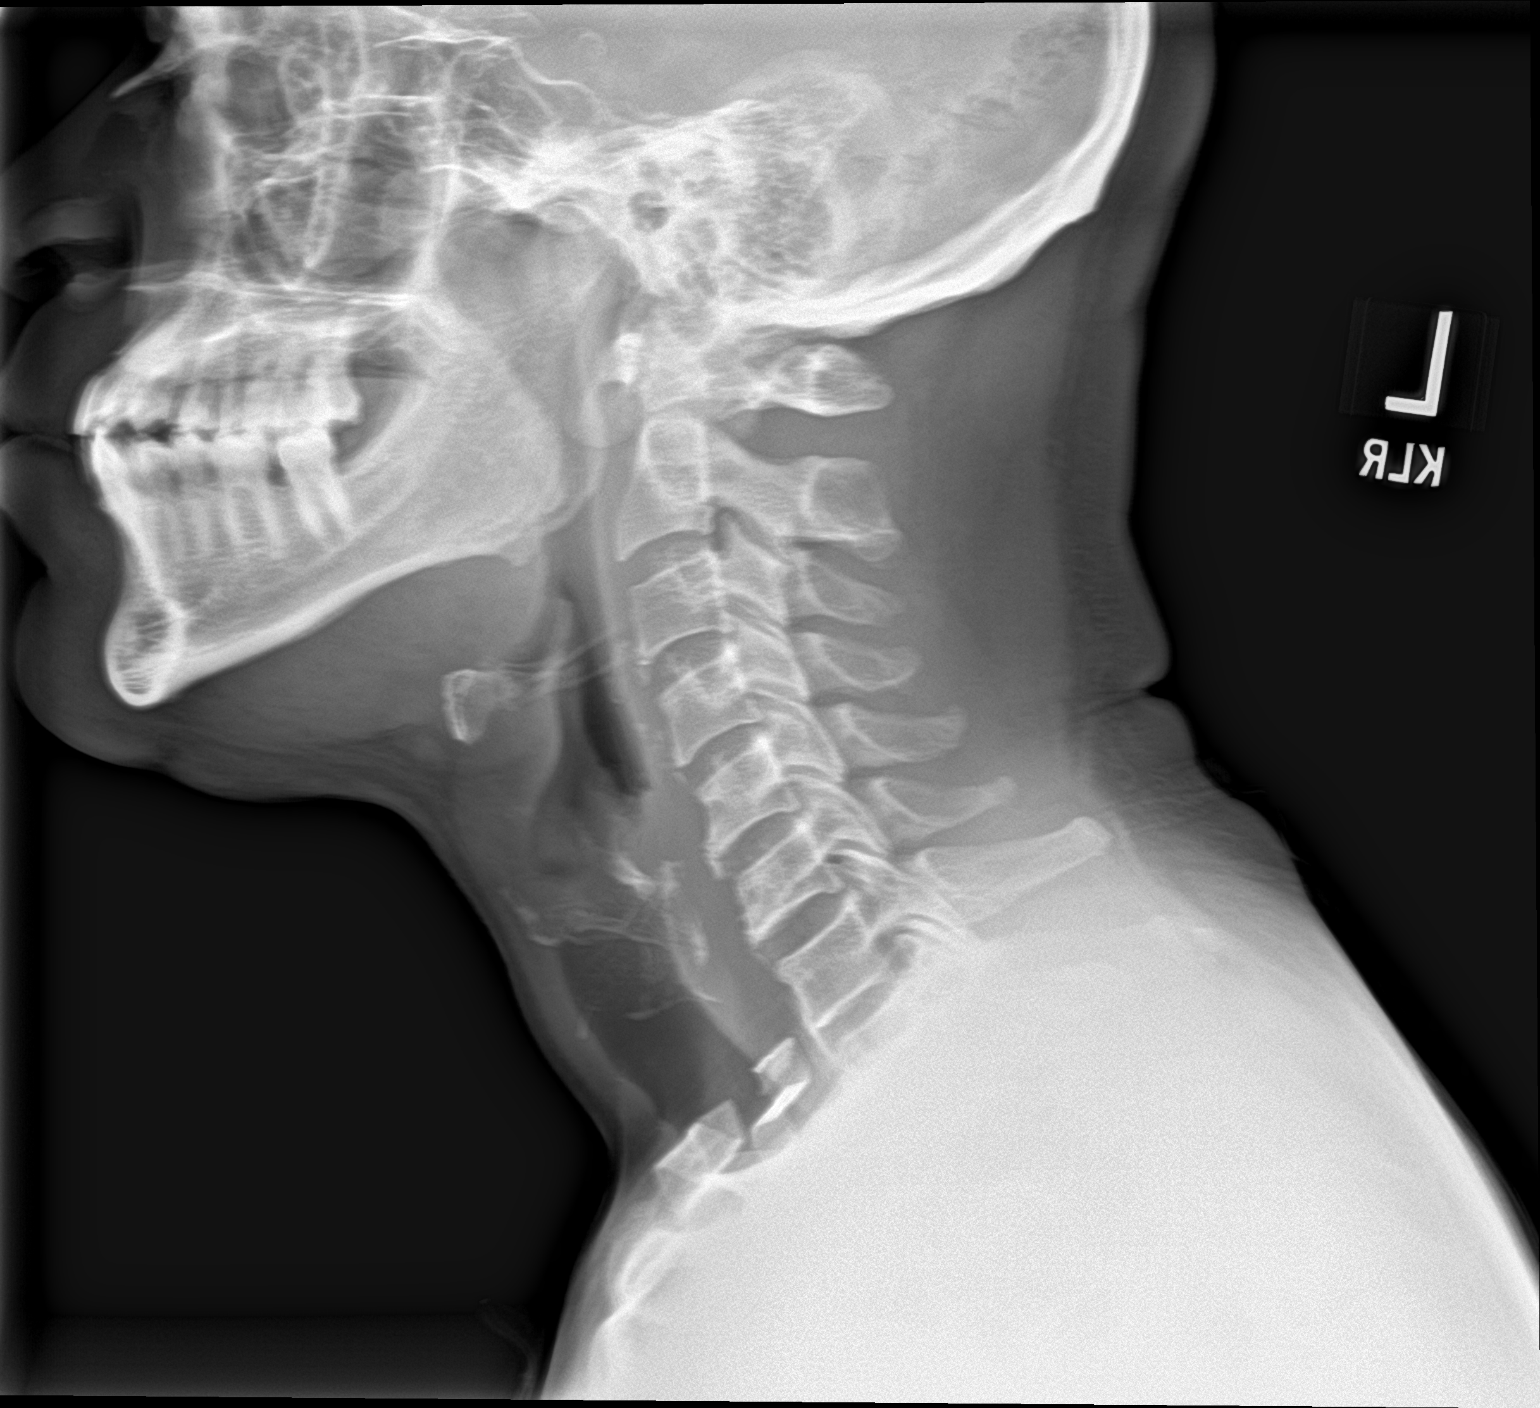

[neck ap]
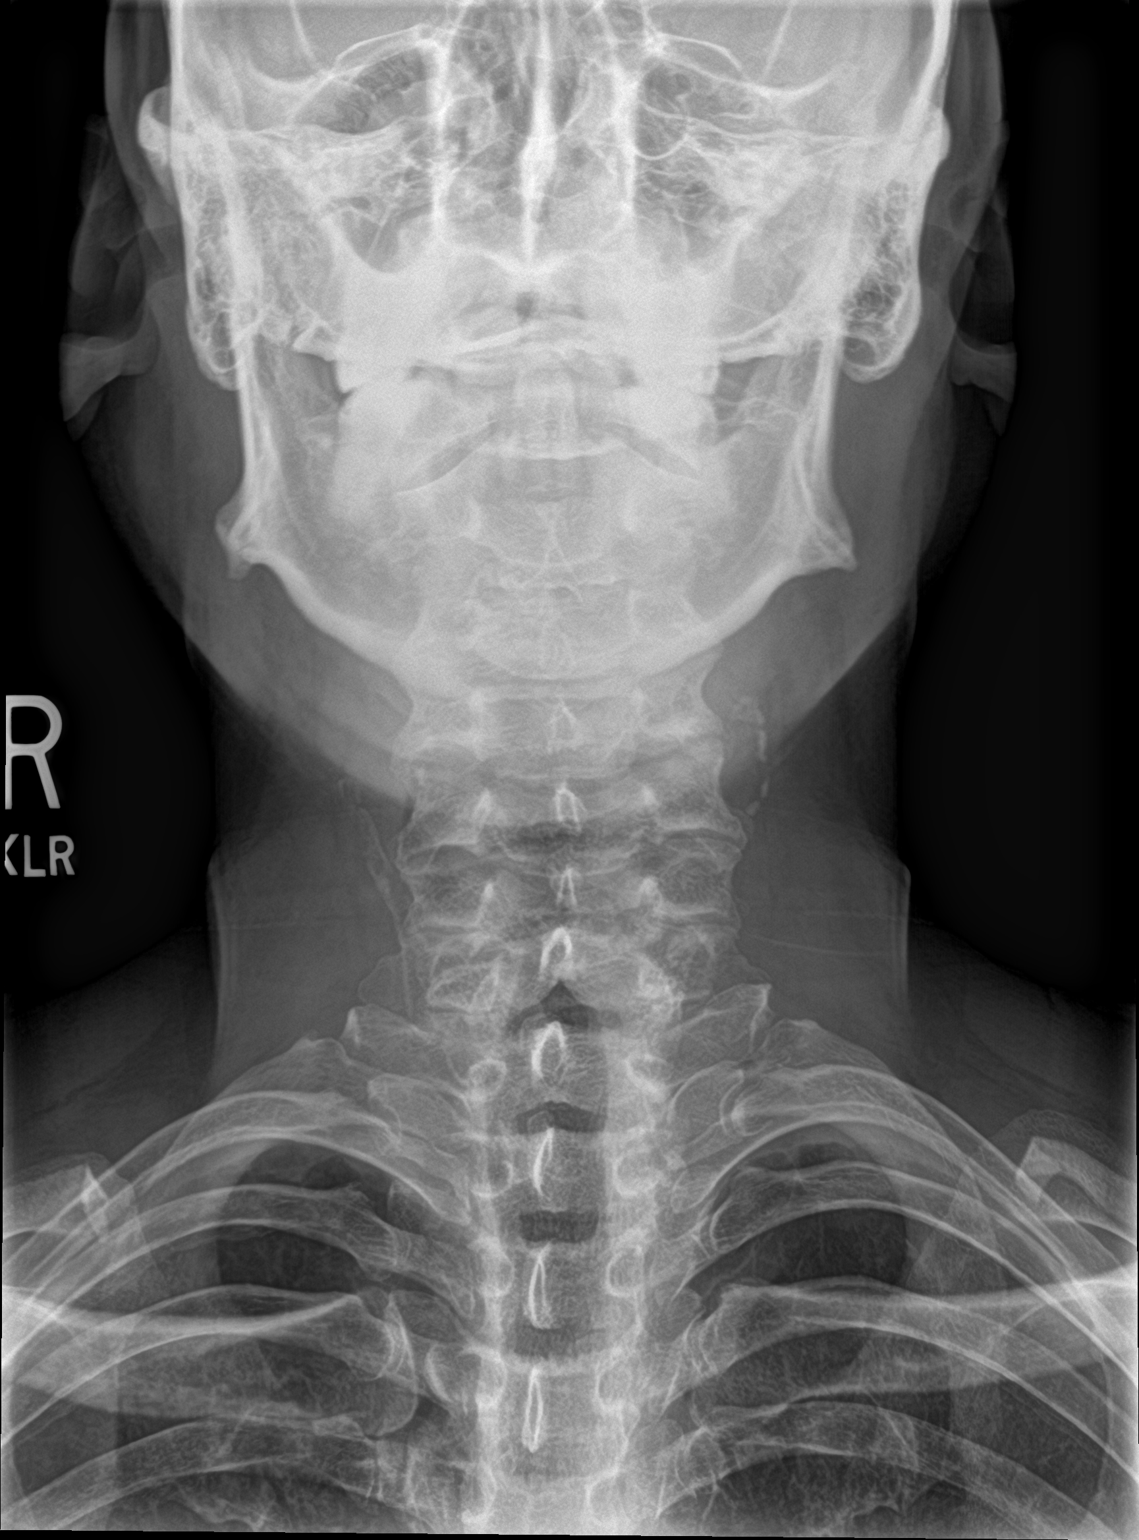

[2 of 2 positions shown; findings below may reference images not displayed]

FINDINGS: Frontal and lateral views were obtained. The epiglottis and
aryepiglottic folds appear within normal limits. Prevertebral soft
tissues are normal. No air-fluid levels. Tongue and tongue base
regions appear within normal limits. Visualized trachea appears
unremarkable. Mild scarring in the upper lobes. There is carotid
artery calcification bilaterally. No focal bone lesions.
IMPRESSION: Epiglottis and aryepiglottic folds appear within normal limits. No
soft tissue mass appreciable by radiography. If there remains
concern for potential mass in the neck region, CT of the neck with
intravenous contrast would be the imaging study of choice to further
evaluate.

Calcification noted in each carotid artery.

## 2022-11-30 ENCOUNTER — Emergency Department: Payer: Self-pay

## 2022-11-30 ENCOUNTER — Emergency Department
Admission: EM | Admit: 2022-11-30 | Discharge: 2022-11-30 | Disposition: A | Discharge status: Home / Self Care | Payer: Self-pay | Attending: Emergency Medicine | Admitting: Emergency Medicine

## 2022-11-30 DIAGNOSIS — R002 Palpitations: Secondary | ICD-10-CM | POA: Insufficient documentation

## 2022-11-30 LAB — CBC
HCT: 37.6 % — ABNORMAL LOW (ref 39.0–52.0)
Hemoglobin: 12.8 g/dL — ABNORMAL LOW (ref 13.0–17.0)
MCH: 30.5 pg (ref 26.0–34.0)
MCHC: 34 g/dL (ref 30.0–36.0)
MCV: 89.7 fL (ref 80.0–100.0)
Platelets: 227 10*3/uL (ref 150–400)
RBC: 4.19 MIL/uL — ABNORMAL LOW (ref 4.22–5.81)
RDW: 15.2 % (ref 11.5–15.5)
WBC: 5 10*3/uL (ref 4.0–10.5)
nRBC: 0 % (ref 0.0–0.2)

## 2022-11-30 LAB — BASIC METABOLIC PANEL
Anion gap: 13 (ref 5–15)
BUN: 15 mg/dL (ref 6–20)
CO2: 24 mmol/L (ref 22–32)
Calcium: 9.4 mg/dL (ref 8.9–10.3)
Chloride: 98 mmol/L (ref 98–111)
Creatinine, Ser: 0.71 mg/dL (ref 0.61–1.24)
GFR, Estimated: >60 mL/min (ref >60)
Glucose, Bld: 96 mg/dL (ref 70–99)
Potassium: 3.9 mmol/L (ref 3.5–5.1)
Sodium: 135 mmol/L (ref 135–145)

## 2022-11-30 LAB — TROPONIN I (HIGH SENSITIVITY)
Troponin I (High Sensitivity): 14 ng/L (ref <18)
Troponin I (High Sensitivity): 14 ng/L (ref <18)

## 2022-11-30 NOTE — ED Provider Notes (Signed)
[facility] Provider Note  Patient Contact: 5:44 PM (approximate)   History   Irregular Heart Beat   HPI  Michael Ingram is a 52 y.o. male who presents the emergency department complaining of palpitations and began yesterday.  Patient will have intermittent fluttering sensation.  No chest pain, no shortness of breath.  He is not had any increased caffeine.  No cardiac history.  Patient denies any new medications.  Patient states that symptoms are not present all the time.  He will have an occasional flutter sensation.     Physical Exam   Triage Vital Signs: ED Triage Vitals  Enc Vitals Group     BP 11/30/22 1650 (!) 158/84     Pulse Rate 11/30/22 1650 98     Resp 11/30/22 1650 18     Temp 11/30/22 1650 98.8 F (37.1 C)     Temp Source 11/30/22 1650 Oral     SpO2 11/30/22 1650 98 %     Weight 11/30/22 1650 169 lb 12.1 oz (77 kg)     Height 11/30/22 1650 '5\' 11"'$  (1.803 m)     Head Circumference --      Peak Flow --      Pain Score 11/30/22 1649 0     Pain Loc --      Pain Edu? --      Excl. in [place]? --     Most recent vital signs: Vitals:   11/30/22 1650 11/30/22 1959  BP: (!) 158/84 (!) 146/88  Pulse: 98 83  Resp: 18 17  Temp: 98.8 F (37.1 C)   SpO2: 98% 99%     General: Alert and in no acute distress.  Cardiovascular:  Good peripheral perfusion.  Normal S1 and S2.  No appreciable murmurs, rubs, gallops. Respiratory: Normal respiratory effort without tachypnea or retractions. Lungs CTAB. Good air entry to the bases with no decreased or absent breath sounds Musculoskeletal: Full range of motion to all extremities.  Neurologic:  No gross focal neurologic deficits are appreciated.  Skin:   No rash noted Other:   ED Results / Procedures / Treatments   Labs (all labs ordered are listed, but only abnormal results are displayed) Labs Reviewed  CBC - Abnormal; Notable for the following components:      Result Value   RBC 4.19 (*)     Hemoglobin 12.8 (*)    HCT 37.6 (*)    All other components within normal limits  BASIC METABOLIC PANEL  TROPONIN I (HIGH SENSITIVITY)  TROPONIN I (HIGH SENSITIVITY)     EKG  ED ECG REPORT I, Charline Bills Aleya Durnell,  personally viewed and interpreted this ECG.   Date: 11/30/2022  EKG Time: 1652 hrs.  Rate: 91 bpm  Rhythm: there are no previous tracings available for comparison, normal sinus rhythm, frequent PVC's noted  Axis: Normal axis  Intervals:none  ST&T Change: No ST elevation or depression noted  Normal sinus rhythm.  Multiple unifocal PVCs identified.  No STEMI.    RADIOLOGY  I personally viewed, evaluated, and interpreted these images as part of my medical decision making, as well as reviewing the written report by the radiologist.  ED Provider Interpretation: No acute cardiopulmonary finding on chest x-ray  DG Chest 2 View  Result Date: 11/30/2022 CLINICAL DATA:  Chest pain EXAM: CHEST - 2 VIEW COMPARISON:  None Available. FINDINGS: The heart size and mediastinal contours are within normal limits. Both lungs are clear. The visualized skeletal structures are  unremarkable. IMPRESSION: No active cardiopulmonary disease. Electronically Signed   By: Marin Roberts M.D.   On: 11/30/2022 17:25    PROCEDURES:  Critical Care performed: No  Procedures   MEDICATIONS ORDERED IN ED: Medications - No data to display   IMPRESSION / MDM / [place] / ED COURSE  I reviewed the triage vital signs and the nursing notes.                              Clinical Course as of 12/01/22 0014  Tue Nov 30, 2022  1848 Troponin I (High Sensitivity): 14 [HH]    Clinical Course User Index [HH] Prince Rome, Student-PA    Differential diagnosis includes, but is not limited to, STEMI, ACS, NSTEMI, bronchitis, pneumonia, costochondritis, arrhythmia, A-fib, palpitations  Patient's presentation is most consistent with acute presentation with potential threat to life or  bodily function.   Patient's diagnosis is consistent with palpitations.  Patient presents emergency department with a fluttering sensation in his chest.  No pain, shortness of breath.  No cardiac history.  Patient did have some PVCs identified on his EKG.  No other signs of cardiac injury on EKG.  No significant arrhythmia.  Though patient did have PVCs no runs of V. tach.  Patient was largely asymptomatic here in the emergency department.  Troponins, labs are reassuring.  Chest x-ray is reassuring.  I recommend following up with cardiology, return precautions discussed with the patient..  Patient is given ED precautions to return to the ED for any worsening or new symptoms.     FINAL CLINICAL IMPRESSION(S) / ED DIAGNOSES   Final diagnoses:  Palpitations     Rx / DC Orders   ED Discharge Orders          Ordered    Ambulatory referral to Cardiology       Comments: If you have not heard from the Cardiology office within the next 72 hours please call [phone].   11/30/22 1950             Note:  This document was prepared using Dragon voice recognition software and may include unintentional dictation errors.   Brynda Peon 12/01/22 0014    Duffy Bruce, MD 12/02/22 [phone]

## 2022-11-30 NOTE — ED Triage Notes (Signed)
Pt presents to the ED due to "fluttering feeling in my back". Pt states the feeling started yesterday. Pt denies CP and SOB. Pt A&Ox4. Pt NAD

## 2022-12-02 ENCOUNTER — Ambulatory Visit: Payer: Self-pay | Admitting: Physician Assistant

## 2022-12-24 ENCOUNTER — Telehealth: Payer: Self-pay | Admitting: Internal Medicine

## 2022-12-24 ENCOUNTER — Ambulatory Visit: Payer: Self-pay | Admitting: Physician Assistant

## 2022-12-24 NOTE — Telephone Encounter (Signed)
New patient no show. Discharged-Toni

## 2023-01-19 ENCOUNTER — Ambulatory Visit: Payer: Self-pay | Attending: Cardiology | Admitting: Cardiology

## 2023-01-20 ENCOUNTER — Encounter: Payer: Self-pay | Admitting: Cardiology

## 2023-03-17 ENCOUNTER — Telehealth: Payer: Self-pay | Admitting: *Deleted

## 2023-03-17 NOTE — Telephone Encounter (Signed)
Lmovm to verify card hx* 

## 2023-03-25 ENCOUNTER — Ambulatory Visit: Payer: Self-pay | Attending: Cardiology | Admitting: Cardiology

## 2023-03-28 ENCOUNTER — Encounter: Payer: Self-pay | Admitting: Cardiology

## 2023-04-17 ENCOUNTER — Emergency Department
Admission: EM | Admit: 2023-04-17 | Discharge: 2023-04-17 | Disposition: A | Discharge status: Home / Self Care | Payer: Self-pay | Attending: Emergency Medicine | Admitting: Emergency Medicine

## 2023-04-17 ENCOUNTER — Emergency Department: Payer: Self-pay

## 2023-04-17 ENCOUNTER — Other Ambulatory Visit: Payer: Self-pay

## 2023-04-17 DIAGNOSIS — R002 Palpitations: Secondary | ICD-10-CM

## 2023-04-17 DIAGNOSIS — I493 Ventricular premature depolarization: Secondary | ICD-10-CM | POA: Insufficient documentation

## 2023-04-17 LAB — CBC
HCT: 31.9 % — ABNORMAL LOW (ref 39.0–52.0)
Hemoglobin: 10.9 g/dL — ABNORMAL LOW (ref 13.0–17.0)
MCH: 32.2 pg (ref 26.0–34.0)
MCHC: 34.2 g/dL (ref 30.0–36.0)
MCV: 94.1 fL (ref 80.0–100.0)
Platelets: 143 10*3/uL — ABNORMAL LOW (ref 150–400)
RBC: 3.39 MIL/uL — ABNORMAL LOW (ref 4.22–5.81)
RDW: 16.1 % — ABNORMAL HIGH (ref 11.5–15.5)
WBC: 5.9 10*3/uL (ref 4.0–10.5)
nRBC: 0 % (ref 0.0–0.2)

## 2023-04-17 LAB — TROPONIN I (HIGH SENSITIVITY)
Troponin I (High Sensitivity): 12 ng/L (ref <18)
Troponin I (High Sensitivity): 14 ng/L (ref <18)

## 2023-04-17 LAB — BASIC METABOLIC PANEL
Anion gap: 12 (ref 5–15)
BUN: 17 mg/dL (ref 6–20)
CO2: 22 mmol/L (ref 22–32)
Calcium: 8.7 mg/dL — ABNORMAL LOW (ref 8.9–10.3)
Chloride: 104 mmol/L (ref 98–111)
Creatinine, Ser: 0.93 mg/dL (ref 0.61–1.24)
GFR, Estimated: >60 mL/min (ref >60)
Glucose, Bld: 92 mg/dL (ref 70–99)
Potassium: 3.5 mmol/L (ref 3.5–5.1)
Sodium: 138 mmol/L (ref 135–145)

## 2023-04-17 LAB — TSH: TSH: 0.764 u[IU]/mL (ref 0.350–4.500)

## 2023-04-17 LAB — MAGNESIUM: Magnesium: 1.7 mg/dL (ref 1.7–2.4)

## 2023-04-17 NOTE — ED Triage Notes (Signed)
Pt to ed from home via POV for heart palpitations x 5 hours. Pt was scheduled to see a cardiologist and hasn't done so. Pt is caox4, and in no acute distress and ambulatory in triage.

## 2023-04-17 NOTE — ED Provider Notes (Signed)
[facility] Provider Note    Event Date/Time   First MD Initiated Contact with Patient 04/17/23 2153     (approximate)   History   Palpitations (X 5 hours)   HPI  Michael Ingram is a 52 y.o. male history of pretension  For the last 3 days has noticed that his heart feels like it is occasionally skipping beats.  He will notice for about a second a flutter feeling that will then go away.  Feels it sort around his neck area.  Comes and goes.  He reports he had similar in March.  Denies chest pain.  No difficulty breathing.  No leg swelling.  He was supposed to see a cardiologist but was unable due to insurance costs.  He is currently actively working to try to establish insurance and obtain follow-up with cardiology.  No other associated symptoms no cough no fever.  He has not felt faint or as though he is going to pass out.     Physical Exam   Triage Vital Signs: ED Triage Vitals  Encounter Vitals Group     BP 04/17/23 1911 (!) 153/91     Systolic BP Percentile --      Diastolic BP Percentile --      Pulse Rate 04/17/23 1911 99     Resp 04/17/23 1911 20     Temp 04/17/23 1911 98.6 F (37 C)     Temp Source 04/17/23 1911 Oral     SpO2 04/17/23 1911 99 %     Weight 04/17/23 1912 170 lb (77.1 kg)     Height 04/17/23 1912 5\' 11"  (1.803 m)     Head Circumference --      Peak Flow --      Pain Score 04/17/23 1916 0     Pain Loc --      Pain Education --      Exclude from Growth Chart --     Most recent vital signs: Vitals:   04/17/23 1911 04/17/23 2300  BP: (!) 153/91 (!) 142/89  Pulse: 99   Resp: 20 17  Temp: 98.6 F (37 C)   SpO2: 99%      General: Awake, no distress.  CV:  Good peripheral perfusion.  Normal tones and rate except for occasional slight irregularity that corresponds with PVCs on telemetry review Resp:  Normal effort.  Clear bilaterally Abd:  No distention.  Soft nontender nondistended Other:  No lower extremity  edema venous cords or congestion   ED Results / Procedures / Treatments   Labs (all labs ordered are listed, but only abnormal results are displayed) Labs Reviewed  BASIC METABOLIC PANEL - Abnormal; Notable for the following components:      Result Value   Calcium 8.7 (*)    All other components within normal limits  CBC - Abnormal; Notable for the following components:   RBC 3.39 (*)    Hemoglobin 10.9 (*)    HCT 31.9 (*)    RDW 16.1 (*)    Platelets 143 (*)    All other components within normal limits  MAGNESIUM  TSH  TROPONIN I (HIGH SENSITIVITY)  TROPONIN I (HIGH SENSITIVITY)   Labs interpreted as normal first troponin.  Very mild hypocalcemia.  Mild anemia appears to be fairly chronic in nature.  Troponin #2, TSH, magnesium, negative for acute  EKG  Interpreted by me at 1920 heart rate 90 QRS 80 QTc 460 Normal sinus rhythm, significant lateral voltages,  suspect probable left ventricular hypertrophy.  Occasional PVCs.  No evidence of frank ischemia.  Repolarization abnormality in anteroseptal leads.  Compared with prior EKG from March 12, morphology unchanged, similar appearance with occasional PVCs.   RADIOLOGY X-ray interpreted by me as negative for acute finding     PROCEDURES:  Critical Care performed: No  Procedures   MEDICATIONS ORDERED IN ED: Medications - No data to display   IMPRESSION / MDM / ASSESSMENT AND PLAN / ED COURSE  I reviewed the triage vital signs and the nursing notes.                              Differential diagnosis includes, but is not limited to, palpitations, car myopathy, holiday heart, thyroid abnormality, hypomagnesemia, electrolyte abnormality etc.  On further history taking patient reports that both times that he has had these palpitations March and today he drank fairly heavily for about 2 days preceding.  In this particular setting I suspect he may be suffering from small amount of holiday heart.  He does not have  acute instability, no presyncopal symptoms no associated chest pain no findings suggestive of acute ACS or life-threatening dysrhythmia.  Discussed with patient recommended he follow-up with cardiology, he is also pursuing insurance options, and I will place referral to cardiology for him once again.  Patient's presentation is most consistent with acute complicated illness / injury requiring diagnostic workup.   The patient is on the cardiac monitor to evaluate for evidence of arrhythmia and/or significant heart rate changes.   Return precautions and treatment recommendations and follow-up discussed with the patient who is agreeable with the plan.      FINAL CLINICAL IMPRESSION(S) / ED DIAGNOSES   Final diagnoses:  Frequent unifocal PVCs  Palpitations     Rx / DC Orders   ED Discharge Orders          Ordered    Ambulatory referral to Cardiology        04/17/23 2218             Note:  This document was prepared using Dragon voice recognition software and may include unintentional dictation errors.   Sharyn Creamer, MD 04/17/23 [phone]

## 2023-07-12 ENCOUNTER — Encounter: Payer: Self-pay | Admitting: Cardiovascular Disease

## 2023-07-12 ENCOUNTER — Ambulatory Visit: Payer: BC Managed Care – PPO | Attending: Cardiovascular Disease | Admitting: Cardiovascular Disease

## 2023-07-12 VITALS — BP 172/102 | HR 69 | Ht 71.0 in | Wt 165.1 lb

## 2023-07-12 DIAGNOSIS — E785 Hyperlipidemia, unspecified: Secondary | ICD-10-CM

## 2023-07-12 DIAGNOSIS — I493 Ventricular premature depolarization: Secondary | ICD-10-CM | POA: Diagnosis not present

## 2023-07-12 DIAGNOSIS — I10 Essential (primary) hypertension: Secondary | ICD-10-CM | POA: Diagnosis not present

## 2023-07-12 MED ORDER — AMLODIPINE BESYLATE 5 MG PO TABS: 5.0000 mg | ORAL_TABLET | Freq: Every day | ORAL | 1 refills | Status: DC

## 2023-07-12 MED ORDER — ATORVASTATIN CALCIUM 20 MG PO TABS: 20.0000 mg | ORAL_TABLET | Freq: Every day | ORAL | 3 refills | Status: AC

## 2023-07-12 NOTE — Progress Notes (Signed)
Cardiology Office Note   Date:  07/12/2023   ID:  Michael Ingram, DOB 1971/08/04, MRN [id number]  PCP:  Patient, No Pcp Per  Cardiologist:   Lorine Bears, MD   Chief Complaint  Patient presents with   New Patient (Initial Visit)    PVC's pt recently went to [facility] ED wore a zio patch that he has to return by mail. Meds reviewed verbally with pt.      History of Present Illness: Michael Ingram is a 52 y.o. male who was referred by [facility] ED for evaluation of palpitations.  He reports prolonged history of essential hypertension but he has not been on any antihypertensive medications over the last year.  He has been scared to take any antihypertensive medication since he took lisinopril and caused angioedema requiring intubation.  He currently does not have a primary care physician.  He has been having palpitations described as skipped beats without tachycardia which has been intermittent over the last year.  He has no associated chest pain, shortness of breath or syncope.  He tries to exercise on a regular basis.  He smokes half a pack per day.  He has no family history of coronary artery disease, sudden death or heart failure.  He works as a Runner, broadcasting/film/video. He had multiple emergency room visits for palpitations.  Most recently was at [facility].  He was discharged with a ZIO monitor but has not sent monitor back for analysis yet.    Past Medical History:  Diagnosis Date   Hypertension     Past Surgical History:  Procedure Laterality Date   INTUBATION-ENDOTRACHEAL WITH TRACHEOSTOMY STANDBY N/A 10/09/2019   Procedure: INTUBATION-ENDOTRACHEAL WITH TRACHEOSTOMY STANDBY;  Surgeon: Linus Salmons, MD;  Location: [facility] ORS;  Service: ENT;  Laterality: N/A;     Current Outpatient Medications  Medication Sig Dispense Refill   atorvastatin (LIPITOR) 20 MG tablet Take 1 tablet (20 mg total) by mouth daily. 90 tablet 3   amLODipine (NORVASC) 5 MG tablet Take 1 tablet (5 mg total) by  mouth daily. 90 tablet 1   chlordiazePOXIDE (LIBRIUM) 25 MG capsule Take 1 capsule (25 mg total) by mouth as directed. (Patient not taking: Reported on 07/12/2023) 30 capsule 0   cyclobenzaprine (FLEXERIL) 10 MG tablet Take 1 tablet (10 mg total) by mouth 3 (three) times daily as needed. (Patient not taking: Reported on 07/12/2023) 15 tablet 0   ibuprofen (ADVIL) 600 MG tablet Take 1 tablet (600 mg total) by mouth every 8 (eight) hours as needed. (Patient not taking: Reported on 07/12/2023) 15 tablet 0   nicotine (NICODERM CQ - DOSED IN MG/24 HOURS) 14 mg/24hr patch Place 1 patch (14 mg total) onto the skin daily. (Patient not taking: Reported on 07/12/2023) 28 patch 0   thiamine 100 MG tablet Take 1 tablet (100 mg total) by mouth daily. (Patient not taking: Reported on 07/12/2023) 90 tablet 0   No current facility-administered medications for this visit.    Allergies:   Lisinopril    Social History:  The patient  reports that he has been smoking cigarettes. He has never used smokeless tobacco. He reports current alcohol use. He reports current drug use. Drug: Marijuana.   Family History:  The patient's family history is not on file.    ROS:  Please see the history of present illness.   Otherwise, review of systems are positive for none.   All other systems are reviewed and negative.    PHYSICAL EXAM: VS:  BP (!) 172/102 (BP Location: Right Arm, Patient Position: Sitting, Cuff Size: Normal)   Pulse 69   Ht 5\' 11"  (1.803 m)   Wt 165 lb 2 oz (74.9 kg)   SpO2 99%   BMI 23.03 kg/m  , BMI Body mass index is 23.03 kg/m. GEN: Well nourished, well developed, in no acute distress  HEENT: normal  Neck: no JVD, carotid bruits, or masses Cardiac: RRR; no murmurs, rubs, or gallops,no edema  Respiratory:  clear to auscultation bilaterally, normal work of breathing GI: soft, nontender, nondistended, + BS MS: no deformity or atrophy  Skin: warm and dry, no rash Neuro:  Strength and sensation  are intact Psych: euthymic mood, full affect   EKG:  EKG is ordered today. The ekg ordered today demonstrates : Normal sinus rhythm Possible Left atrial enlargement Left ventricular hypertrophy ( Sokolow-Lyon , Cornell product , Romhilt-Estes ) Nonspecific T wave abnormality When compared with ECG of 17-Apr-2023 19:17, Premature ventricular complexes are no longer Present Nonspecific T wave abnormality no longer evident in Inferior leads      Recent Labs: 04/17/2023: BUN 17; Creatinine, Ser 0.93; Hemoglobin 10.9; Magnesium 1.7; Platelets 143; Potassium 3.5; Sodium 138; TSH 0.764    Lipid Panel    Component Value Date/Time   TRIG 123 10/13/2019 0549      Wt Readings from Last 3 Encounters:  07/12/23 165 lb 2 oz (74.9 kg)  04/17/23 170 lb (77.1 kg)  11/30/22 169 lb 12.1 oz (77 kg)          07/12/2023    3:02 PM  PAD Screen  Previous PAD dx? No  Previous surgical procedure? No  Pain with walking? No  Feet/toe relief with dangling? No  Painful, non-healing ulcers? No  Extremities discolored? No      ASSESSMENT AND PLAN:  1.  Symptomatic PVCs: Based on his description, I suspect that he has PVCs.  He does not describe prolonged tachycardia.  I requested an echocardiogram to ensure no structural heart abnormalities or cardiomyopathy. Will await the results of ZIO monitor to evaluate the burden and see if medications are needed.  2.  Essential hypertension uncontrolled with evidence of left ventricular hypertrophy on EKG.  I discussed with him the importance of getting this treated.  He has been hesitant since he had angioedema with lisinopril.  I elected to start him on amlodipine 5 mg once daily.  If his blood pressure remains uncontrolled, consider a thiazide diuretic.  3.  Hyperlipidemia: He had recent lipid profile which showed an LDL of 190.  Recommend treatment with a statin.  I started atorvastatin 20 mg daily.  He will require follow-up lipid and liver profile  in the next 2 months.  4.  Tobacco use: Discussed the importance of smoking cessation.    Disposition:   FU in 1 month.  Signed,  Lorine Bears, MD  07/12/2023 3:25 PM    [facility]

## 2023-07-12 NOTE — Patient Instructions (Signed)
Medication Instructions:  START Amlodipine 5 mg once daily START Atorvastatin 20 mg once daily  *If you need a refill on your cardiac medications before your next appointment, please call your pharmacy*   Lab Work: None ordered If you have labs (blood work) drawn today and your tests are completely normal, you will receive your results only by: MyChart Message (if you have MyChart) OR A paper copy in the mail If you have any lab test that is abnormal or we need to change your treatment, we will call you to review the results.   Testing/Procedures: Your physician has requested that you have an echocardiogram. Echocardiography is a painless test that uses sound waves to create images of your heart. It provides your doctor with information about the size and shape of your heart and how well your heart's chambers and valves are working.   You may receive an ultrasound enhancing agent through an IV if needed to better visualize your heart during the echo. This procedure takes approximately one hour.  There are no restrictions for this procedure.  This will take place at [address] [address], [place] [address]    Follow-Up: At [facility], you and your health needs are our priority.  As part of our continuing mission to provide you with exceptional heart care, we have created designated Provider Care Teams.  These Care Teams include your primary Cardiologist (physician) and Advanced Practice Providers (APPs -  Physician Assistants and Nurse Practitioners) who all work together to provide you with the care you need, when you need it.  We recommend signing up for the patient portal called "MyChart".  Sign up information is provided on this After Visit Summary.  MyChart is used to connect with patients for Virtual Visits (Telemedicine).  Patients are able to view lab/test results, encounter notes, upcoming appointments, etc.  Non-urgent messages can be sent to  your provider as well.   To learn more about what you can do with MyChart, go to [email or web address].    Your next appointment:   1 month(s)  Provider:   You will see one of the following Advanced Practice Providers on your designated Care Team:   Nicolasa Ducking, NP Eula Listen, PA-C Cadence Fransico Michael, PA-C Charlsie Quest, NP  Managing the Challenge of Quitting Smoking Quitting smoking is a physical and mental challenge. You may have cravings, withdrawal symptoms, and temptation to smoke. Before quitting, work with your health care provider to make a plan that can help you manage quitting. Making a plan before you quit may keep you from smoking when you have the urge to smoke while trying to quit. How to manage lifestyle changes Managing stress Stress can make you want to smoke, and wanting to smoke may cause stress. It is important to find ways to manage your stress. You could try some of the following: Practice relaxation techniques. Breathe slowly and deeply, in through your nose and out through your mouth. Listen to music. Soak in a bath or take a shower. Imagine a peaceful place or vacation. Get some support. Talk with family or friends about your stress. Join a support group. Talk with a counselor or therapist. Get some physical activity. Go for a walk, run, or bike ride. Play a favorite sport. Practice yoga.  Medicines Talk with your health care provider about medicines that might help you deal with cravings and make quitting easier for you. Relationships Social situations can be difficult when you are quitting smoking.  To manage this, you can: Avoid parties and other social situations where people might be smoking. Avoid alcohol. Leave right away if you have the urge to smoke. Explain to your family and friends that you are quitting smoking. Ask for support and let them know you might be a bit grumpy. Plan activities where smoking is not an option. General  instructions Be aware that many people gain weight after they quit smoking. However, not everyone does. To keep from gaining weight, have a plan in place before you quit, and stick to the plan after you quit. Your plan should include: Eating healthy snacks. When you have a craving, it may help to: Eat popcorn, or try carrots, celery, or other cut vegetables. Chew sugar-free gum. Changing how you eat. Eat small portion sizes at meals. Eat 4-6 small meals throughout the day instead of 1-2 large meals a day. Be mindful when you eat. You should avoid watching television or doing other things that might distract you as you eat. Exercising regularly. Make time to exercise each day. If you do not have time for a long workout, do short bouts of exercise for 5-10 minutes several times a day. Do some form of strengthening exercise, such as weight lifting. Do some exercise that gets your heart beating and causes you to breathe deeply, such as walking fast, running, swimming, or biking. This is very important. Drinking plenty of water or other low-calorie or no-calorie drinks. Drink enough fluid to keep your urine pale yellow.  How to recognize withdrawal symptoms Your body and mind may experience discomfort as you try to get used to not having nicotine in your system. These effects are called withdrawal symptoms. They may include: Feeling hungrier than normal. Having trouble concentrating. Feeling irritable or restless. Having trouble sleeping. Feeling depressed. Craving a cigarette. These symptoms may surprise you, but they are normal to have when quitting smoking. To manage withdrawal symptoms: Avoid places, people, and activities that trigger your cravings. Remember why you want to quit. Get plenty of sleep. Avoid coffee and other drinks that contain caffeine. These may worsen some of your symptoms. How to manage cravings Come up with a plan for how to deal with your cravings. The plan should  include the following: A definition of the specific situation you want to deal with. An activity or action you will take to replace smoking. A clear idea for how this action will help. The name of someone who could help you with this. Cravings usually last for 5-10 minutes. Consider taking the following actions to help you with your plan to deal with cravings: Keep your mouth busy. Chew sugar-free gum. Suck on hard candies or a straw. Brush your teeth. Keep your hands and body busy. Change to a different activity right away. Squeeze or play with a ball. Do an activity or a hobby, such as making bead jewelry, practicing needlepoint, or working with wood. Mix up your normal routine. Take a short exercise break. Go for a quick walk, or run up and down stairs. Focus on doing something kind or helpful for someone else. Call a friend or family member to talk during a craving. Join a support group. Contact a quitline. Where to find support To get help or find a support group: Call the National Cancer Institute's Smoking Quitline: 1-800-QUIT-NOW 906-491-1975) Text QUIT to SmokefreeTXT: 696295 Where to find more information Visit these websites to find more information on quitting smoking: U.S. Department of Health and Human Services: www.smokefree.gov American  Lung Association: www.freedomfromsmoking.org Centers for Disease Control and Prevention (CDC): FootballExhibition.com.br American Heart Association: www.heart.org Contact a health care provider if: You want to change your plan for quitting. The medicines you are taking are not helping. Your eating feels out of control or you cannot sleep. You feel depressed or become very anxious. Summary Quitting smoking is a physical and mental challenge. You will face cravings, withdrawal symptoms, and temptation to smoke again. Preparation can help you as you go through these challenges. Try different techniques to manage stress, handle social situations, and  prevent weight gain. You can deal with cravings by keeping your mouth busy (such as by chewing gum), keeping your hands and body busy, calling family or friends, or contacting a quitline for people who want to quit smoking. You can deal with withdrawal symptoms by avoiding places where people smoke, getting plenty of rest, and avoiding drinks that contain caffeine. This information is not intended to replace advice given to you by your health care provider. Make sure you discuss any questions you have with your health care provider. Document Revised: 08/28/2021 Document Reviewed: 08/28/2021 Elsevier Patient Education  2024 [employer].

## 2023-07-27 ENCOUNTER — Ambulatory Visit: Payer: BC Managed Care – PPO | Attending: Cardiovascular Disease

## 2023-07-30 NOTE — Progress Notes (Deleted)
   Cardiology Clinic Note   Date: 07/30/2023 ID: AHMAR SIEG, DOB 1971/08/15, MRN [id number]  Primary Cardiologist:  Lorine Bears, MD  Patient Profile    Michael Ingram is a 52 y.o. male who presents to the clinic today for ***    Past medical history significant for: Palpitations/PVCs. Hypertension. Hyperlipidemia.  Lipid panel 06/22/2023: LDL 191, HDL 104, TG 145, total 324. Tobacco abuse.   In summary, was first evaluated by Dr. Kirke Corin on 07/12/2023 for palpitations. He reported a long history of hypertension and had not been on anti-hypertensives for over a year. 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 He had multiple ED visits for palpitations. Most recent visit at [facility]. He was discharged with Zio that had not yet been mailed back at the time of his visit. He was started on amlodipine and atorvastatin.  Echo was ordered but does not appear to be completed.      History of Present Illness    Michael Ingram is followed by Dr. Kirke Corin for the above outlined history.   Discussed the use of AI scribe software for clinical note transcription with the patient, who gave verbal consent to proceed.             ROS: All other systems reviewed and are otherwise negative except as noted in History of Present Illness.  Studies Reviewed       ***  Risk Assessment/Calculations    {Does this patient have ATRIAL FIBRILLATION?:[phone]} No BP recorded.  {Refresh Note OR Click here to enter BP  :1}***        Physical Exam    VS:  There were no vitals taken for this visit. , BMI There is no height or weight on file to calculate BMI.  GEN: Well nourished, well developed, in no acute distress. Neck: No JVD or carotid bruits. Cardiac: *** RRR. No murmurs. No rubs or gallops.   Respiratory:  Respirations regular and unlabored. Clear to auscultation without rales, wheezing or rhonchi. GI: Soft, nontender,  nondistended. Extremities: Radials/DP/PT 2+ and equal bilaterally. No clubbing or cyanosis. No edema ***  Skin: Warm and dry, no rash. Neuro: Strength intact.  Assessment & Plan   Assessment and Plan               Disposition: ***     {Are you ordering a CV Procedure (e.g. stress test, cath, DCCV, TEE, etc)?   Press F2        :[id number]}   Signed, Etta Grandchild. Sladen Plancarte, DNP, NP-C

## 2023-08-01 ENCOUNTER — Ambulatory Visit: Payer: BC Managed Care – PPO | Admitting: Student

## 2023-08-04 NOTE — Progress Notes (Signed)
Cardiology Clinic Note   Date: 08/05/2023 ID: Michael Ingram, DOB 1970/11/21, MRN [id number]  Primary Cardiologist:  Lorine Bears, MD  Patient Profile    Michael Ingram is a 52 y.o. male who presents to the clinic today for follow up after medication changes.     Past medical history significant for: Palpitations/PVCs. Hypertension. Hyperlipidemia.  Lipid panel 06/22/2023: LDL 191, HDL 104, TG 145, total 324. Tobacco abuse.   In summary, was first evaluated by Dr. Kirke Corin on 07/12/2023 for palpitations. He reported a long history of hypertension and had not been on anti-hypertensives for over a year. 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 He had multiple ED visits for palpitations. Most recent visit at [facility]. He was discharged with Zio that had not yet been mailed back at the time of his visit. He was started on amlodipine and atorvastatin.  Echo was ordered but does not appear to be completed.      History of Present Illness    Michael Ingram is followed by Dr. Kirke Corin for the above outlined history.   Discussed the use of AI scribe software for clinical note transcription with the patient, who gave verbal consent to proceed.  The patient is accompanied by his wife. He presents for follow-up after medication changes. He was supposed to start amlodipine and atorvastatin.  However, patient has not started medications secondary to fear after he had an anaphylactic reaction to Lisinopril in the past. BP remains elevated. He denies headaches or dizziness. He also reports continued palpitations but has a hard time describing them. He states he notices them when he lays down and it feels like a "thumping" sensation in his back. Patient admits to being a heavy daily alcohol drinker. He can typically consume a 5th of hard liquor over 2 days and closer to a whole 5th on the weekends. He has not had any alcohol since Sunday night. He also smokes  a pack a day. Patient denies shortness of breath, dyspnea on exertion, lower extremity edema, orthopnea or PND. No chest pain, pressure, or tightness. Patient is an Nurse, learning disability. He exercises nightly. He admits the quality of his exercising may be low, as he has typically consumed alcohol prior to exercising.       ROS: All other systems reviewed and are otherwise negative except as noted in History of Present Illness.  Studies Reviewed     EKG is not ordered today.   Risk Assessment/Calculations      HYPERTENSION CONTROL Vitals:   08/05/23 1553 08/05/23 1712  BP: (!) 160/110 (!) 144/90    The patient's blood pressure is elevated above target today.  In order to address the patient's elevated BP: A new medication was prescribed today.           Physical Exam    VS:  BP (!) 160/110 (BP Location: Left Arm, Patient Position: Sitting)   Pulse 88   Ht 5\' 11"  (1.803 m)   Wt 159 lb (72.1 kg)   SpO2 99%   BMI 22.18 kg/m  , BMI Body mass index is 22.18 kg/m.  GEN: Well nourished, well developed, in no acute distress. Neck: No JVD or carotid bruits. Cardiac:  RRR. No murmurs. No rubs or gallops.   Respiratory:  Respirations regular and unlabored. Clear to auscultation without rales, wheezing or rhonchi. GI: Soft, nontender, nondistended. Extremities: Radials/DP/PT 2+ and equal bilaterally. No clubbing or cyanosis. No edema.  Skin: Warm and  dry, no rash. Neuro: Strength intact.  Assessment & Plan      Hypertension BP today 160/110 on intake and 144/90 on my recheck. Patient has not started amlodipine out of fear of adverse reaction. He previously had anaphylaxis with Lisinopril. Discussed importance of blood pressure control and the long term effects of uncontrolled BP. Patient is agreeable to start amlodipine and waiting to start atorvastatin.  -Start amlodipine 5 mg daily. -Check blood pressure two hours post-medication or in the evening if not possible. BP log provided.    Palpitations Recurrent palpitations that patient has difficulty describing. Previous episodes led to multiple ER visits. He wore a heart monitor provided by [facility] ED but never mailed it back. He may have misplaced the monitor. RRR on exam today.  -Will hold off on re-ordering heart monitor until patient sees if he can find the monitor and the information to mail it back in. Will consider re-ordering if he cannot find it and/or palpitations persist.  -Will schedule previously ordered echo.   Hyperlipidemia LDL October 2024 191. Patient is concerned about starting atorvastatin secondary to previous reaction to Lisinopril.  -Plan to start atorvastatin after ensuring tolerance to amlodipine.  Tobacco Abuse Pack a day smoker with interest in quitting. -Encourage gradual reduction in smoking, setting small achievable goals.  Alcohol Abuse Heavy alcohol consumption, approximately a fifth over 2 days and more on the weekends. I would normally not recommend abrupt cessation but patient has not consumed any alcohol for 5 days. He denies DTs or other adverse symptoms. Discussed alcohol's role in palpitations and other arrhythmias.  -Encourage continued cessation.         Disposition: Return in 6 weeks or sooner as needed.          Signed, Michael Grandchild. Varnell Donate, DNP, NP-C

## 2023-08-05 ENCOUNTER — Encounter: Payer: Self-pay | Admitting: Student

## 2023-08-05 ENCOUNTER — Ambulatory Visit: Payer: BC Managed Care – PPO | Attending: Student | Admitting: Student

## 2023-08-05 VITALS — BP 144/90 | HR 88 | Ht 71.0 in | Wt 159.0 lb

## 2023-08-05 DIAGNOSIS — R002 Palpitations: Secondary | ICD-10-CM

## 2023-08-05 DIAGNOSIS — E785 Hyperlipidemia, unspecified: Secondary | ICD-10-CM

## 2023-08-05 DIAGNOSIS — F1010 Alcohol abuse, uncomplicated: Secondary | ICD-10-CM

## 2023-08-05 DIAGNOSIS — Z720 Tobacco use: Secondary | ICD-10-CM | POA: Diagnosis not present

## 2023-08-05 DIAGNOSIS — I10 Essential (primary) hypertension: Secondary | ICD-10-CM

## 2023-08-05 NOTE — Patient Instructions (Addendum)
Medication Instructions:  START the Amlodipine  *If you need a refill on your cardiac medications before your next appointment, please call your pharmacy*   Lab Work: None ordered If you have labs (blood work) drawn today and your tests are completely normal, you will receive your results only by: MyChart Message (if you have MyChart) OR A paper copy in the mail If you have any lab test that is abnormal or we need to change your treatment, we will call you to review the results.   Testing/Procedures: None ordered   Follow-Up: At [facility], you and your health needs are our priority.  As part of our continuing mission to provide you with exceptional heart care, we have created designated Provider Care Teams.  These Care Teams include your primary Cardiologist (physician) and Advanced Practice Providers (APPs -  Physician Assistants and Nurse Practitioners) who all work together to provide you with the care you need, when you need it.  We recommend signing up for the patient portal called "MyChart".  Sign up information is provided on this After Visit Summary.  MyChart is used to connect with patients for Virtual Visits (Telemedicine).  Patients are able to view lab/test results, encounter notes, upcoming appointments, etc.  Non-urgent messages can be sent to your provider as well.   To learn more about what you can do with MyChart, go to [email or web address].    Your next appointment:   6 week(s)  Provider:   You may see Lorine Bears, MD or one of the following Advanced Practice Providers on your designated Care Team:   Carlos Levering, NP    Your provider would like you to check your blood pressure daily.Marland Kitchen  Keep a journal of these daily blood pressure and heart rate readings and call our office or send a message through MyChart with the results. Thank you!  It is best to check your BP 1-2 hours after taking your medications to see the medications effectiveness on  your BP.    Here are some tips that our clinical pharmacists share for home BP monitoring:          Rest 10 minutes before taking your blood pressure.          Don't smoke or drink caffeinated beverages for at least 30 minutes before.          Take your blood pressure before (not after) you eat.          Sit comfortably with your back supported and both feet on the floor (don't cross your legs).          Elevate your arm to heart level on a table or a desk.          Use the proper sized cuff. It should fit smoothly and snugly around your bare upper arm. There should be enough room to slip a fingertip under the cuff. The bottom edge of the cuff should be 1 inch above the crease of the elbow.

## 2023-08-30 ENCOUNTER — Other Ambulatory Visit: Payer: Self-pay | Admitting: Cardiovascular Disease

## 2023-08-30 ENCOUNTER — Ambulatory Visit: Payer: BC Managed Care – PPO | Attending: Cardiovascular Disease

## 2023-08-30 DIAGNOSIS — I493 Ventricular premature depolarization: Secondary | ICD-10-CM

## 2023-08-30 DIAGNOSIS — I10 Essential (primary) hypertension: Secondary | ICD-10-CM

## 2023-08-30 DIAGNOSIS — E785 Hyperlipidemia, unspecified: Secondary | ICD-10-CM

## 2023-08-30 LAB — ECHOCARDIOGRAM COMPLETE
Area-P 1/2: 3.77 cm2
S' Lateral: 2.8 cm

## 2023-09-03 NOTE — Progress Notes (Unsigned)
Cardiology Clinic Note   Date: 09/03/2023 ID: Michael Ingram, DOB 05/10/1971, MRN [id number]  Primary Cardiologist:  Lorine Bears, MD  Patient Profile    Michael Ingram is a 52 y.o. male who presents to the clinic today for ***    Past medical history significant for: Palpitations/PVCs. Echo 08/30/2023: EF 55 to 60%.  No RWMA.  Mild LVH.  Normal RV size/function.  Mild MR. Hypertension. Hyperlipidemia.  Lipid panel 06/22/2023: LDL 191, HDL 104, TG 145, total 324. Tobacco abuse. Alcohol use.  In summary, was first evaluated by Dr. Kirke Corin on 07/12/2023 for palpitations. He reported a long history of hypertension and had not been on anti-hypertensives for over a year. 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 He had multiple ED visits for palpitations. Most recent visit at [facility]. He was discharged with Zio that had not yet been mailed back at the time of his visit. He was started on amlodipine and atorvastatin.  Echo was ordered but does not appear to be completed.       History of Present Illness    Michael Ingram is followed by Dr. Kirke Corin for the above outlined history.  Patient was last seen in the office by me on 08/05/2023 for follow-up after medication changes.  At that time patient had not started amlodipine for fear after anaphylactic reaction to lisinopril.  BP was elevated at that time.  Patient reported heavy daily intake of alcohol consuming a fifth of hard liquor over 2 days and closer to a whole fifth on weekends.  He agreed to starting amlodipine.  Today, patient ***  Hypertension BP today*** Home BP*** -Continue amlodipine.   Palpitations Recurrent palpitations that patient has difficulty describing. Previous episodes led to multiple ER visits. He wore a heart monitor provided by [facility] ED but never mailed it back. He may have misplaced the monitor. RRR on exam today.***   Hyperlipidemia LDL October 2024  191. Patient is concerned about starting atorvastatin secondary to previous reaction to Lisinopril.  -Plan to start atorvastatin after ensuring tolerance to amlodipine.***   Tobacco Abuse Pack a day smoker with interest in quitting.*** -Encourage gradual reduction in smoking, setting small achievable goals.   Alcohol Abuse Heavy alcohol consumption, approximately a fifth over 2 days and more on the weekends. I would normally not recommend abrupt cessation but patient has not consumed any alcohol for 5 days. He denies DTs or other adverse symptoms. Discussed alcohol's role in palpitations and other arrhythmias.*** -Encourage continued cessation.    ROS: All other systems reviewed and are otherwise negative except as noted in History of Present Illness.  Studies Reviewed       ***  Risk Assessment/Calculations    {Does this patient have ATRIAL FIBRILLATION?:[phone]} No BP recorded.  {Refresh Note OR Click here to enter BP  :1}***        Physical Exam    VS:  There were no vitals taken for this visit. , BMI There is no height or weight on file to calculate BMI.  GEN: Well nourished, well developed, in no acute distress. Neck: No JVD or carotid bruits. Cardiac: *** RRR. No murmurs. No rubs or gallops.   Respiratory:  Respirations regular and unlabored. Clear to auscultation without rales, wheezing or rhonchi. GI: Soft, nontender, nondistended. Extremities: Radials/DP/PT 2+ and equal bilaterally. No clubbing or cyanosis. No edema ***  Skin: Warm and dry, no rash. Neuro: Strength intact.  Assessment & Plan   ***  Disposition: ***     {Are you ordering a CV Procedure (e.g. stress test, cath, DCCV, TEE, etc)?   Press F2        :[id number]}   Signed, Etta Grandchild. Lorriann Hansmann, DNP, NP-C

## 2023-09-05 ENCOUNTER — Ambulatory Visit: Payer: BC Managed Care – PPO | Admitting: Student

## 2023-09-11 NOTE — Progress Notes (Unsigned)
Cardiology Clinic Note   Date: 09/12/2023 ID: VIRDEN MCLAFFERTY, DOB 11-01-1970, MRN [id number]  Primary Cardiologist:  Lorine Bears, MD  Patient Profile    Michael Ingram is a 52 y.o. male who presents to the clinic today for follow up after medication changes.     Past medical history significant for: Palpitations/PVCs. Echo 08/30/2023: EF 55 to 60%.  No RWMA.  Mild LVH.  Normal RV size/function.  Mild MR. Hypertension. Hyperlipidemia.  Lipid panel 06/22/2023: LDL 191, HDL 104, TG 145, total 324. Tobacco abuse. Alcohol use.  In summary, was first evaluated by Dr. Kirke Corin on 07/12/2023 for palpitations. He reported a long history of hypertension and had not been on anti-hypertensives for over a year. 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 He had multiple ED visits for palpitations. Most recent visit at [facility]. He was discharged with Zio that had not yet been mailed back at the time of his visit. He was started on amlodipine and atorvastatin.  Echo was ordered but does not appear to be completed.       History of Present Illness    Michael Ingram is followed by Dr. Kirke Corin for the above outlined history.  Patient was last seen in the office by me on 08/05/2023 for follow-up after medication changes.  At that time patient had not started amlodipine for fear after anaphylactic reaction to lisinopril.  BP was elevated at that time.  Patient reported heavy daily intake of alcohol consuming a fifth of hard liquor over 2 days and closer to a whole fifth on weekends.  He agreed to starting amlodipine.  Today, patient reports he has been tolerating amlodipine well. He has not had medication over the last 3 days (including this morning) as he was out of town and forgot his medication. He does not check his blood pressure at home. He denies dizziness or headaches. He is working on cutting back on smoking. He smokes about 1/2 pack a day. He is also  only drinking on the weekends and has reduced the amount of alcohol he consumes. He denies alcohol withdrawal symptoms on the days he does not drink. No chest pain, shortness of breath, DOE, lower extremity edema, orthopnea or PND. 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      ROS: All other systems reviewed and are otherwise negative except as noted in History of Present Illness.  Studies Reviewed    EKG not ordered today.  Physical Exam    VS:  BP 132/86 (BP Location: Left Arm, Patient Position: Sitting, Cuff Size: Normal)   Pulse 84   Ht 5\' 11"  (1.803 m)   Wt 162 lb (73.5 kg)   SpO2 99%   BMI 22.59 kg/m  , BMI Body mass index is 22.59 kg/m.  GEN: Well nourished, well developed, in no acute distress. Neck: No JVD or carotid bruits. Cardiac:  RRR. No murmurs. No rubs or gallops.   Respiratory:  Respirations regular and unlabored. Clear to auscultation without rales, wheezing or rhonchi. GI: Soft, nontender, nondistended. Extremities: Radials/DP/PT 2+ and equal bilaterally. No clubbing or cyanosis. No edema.  Skin: Warm and dry, no rash. Neuro: Strength intact.  Assessment & Plan   Hypertension BP today 140/88 on intake and 132/86 on my recheck. He has not had medication for 3 days. He is not checking BP at home. He denies dizziness or  headaches. -Patient is instructed to send in 1 week of BP readings. Log provided.  -Continue amlodipine.   Hyperlipidemia LDL October 2024 191. Patient is concerned about starting atorvastatin secondary to previous reaction to Lisinopril.  -Patient will start atorvastatin.  -Will repeat lipid panel and LFTs at return visit.    Tobacco Abuse Patient reports cutting back smoking to half a pack a day.  -Encourage continued gradual reduction in smoking until complete cessation.   Alcohol Abuse Patient reports only drinking on the weekends and cutting back on amount he drinks. He  does not quantify amount today. He denies DTs or other adverse symptoms.  -Encourage complete cessation.   Disposition: Start atorvastatin. Send in 1 week BP log. Return in 3 months or sooner as needed.          Signed, Michael Grandchild. Emrys Mceachron, DNP, NP-C

## 2023-09-12 ENCOUNTER — Encounter: Payer: Self-pay | Admitting: Student

## 2023-09-12 ENCOUNTER — Ambulatory Visit: Payer: BC Managed Care – PPO | Attending: Student | Admitting: Student

## 2023-09-12 VITALS — BP 132/86 | HR 84 | Ht 71.0 in | Wt 162.0 lb

## 2023-09-12 DIAGNOSIS — I10 Essential (primary) hypertension: Secondary | ICD-10-CM | POA: Diagnosis not present

## 2023-09-12 DIAGNOSIS — F1010 Alcohol abuse, uncomplicated: Secondary | ICD-10-CM

## 2023-09-12 DIAGNOSIS — E782 Mixed hyperlipidemia: Secondary | ICD-10-CM | POA: Diagnosis not present

## 2023-09-12 DIAGNOSIS — Z720 Tobacco use: Secondary | ICD-10-CM

## 2023-09-12 NOTE — Patient Instructions (Addendum)
Medication Instructions:  Your physician recommends that you continue on your current medications as directed. Please refer to the Current Medication list given to you today.   *If you need a refill on your cardiac medications before your next appointment, please call your pharmacy*   No labs ordered today    Testing/Procedures: No test ordered today    Follow-Up: At [facility], you and your health needs are our priority.  As part of our continuing mission to provide you with exceptional heart care, we have created designated Provider Care Teams.  These Care Teams include your primary Cardiologist (physician) and Advanced Practice Providers (APPs -  Physician Assistants and Nurse Practitioners) who all work together to provide you with the care you need, when you need it.  We recommend signing up for the patient portal called "MyChart".  Sign up information is provided on this After Visit Summary.  MyChart is used to connect with patients for Virtual Visits (Telemedicine).  Patients are able to view lab/test results, encounter notes, upcoming appointments, etc.  Non-urgent messages can be sent to your provider as well.   To learn more about what you can do with MyChart, go to [email or web address].    Your next appointment:   3 month(s)  Provider:   You may see Lorine Bears, MD or one of the following Advanced Practice Providers on your designated Care Team:   Nicolasa Ducking, NP Eula Listen, PA-C Cadence Fransico Michael, PA-C Charlsie Quest, NP Carlos Levering, NP    Carlos Levering, NP recommend checking and keeping a log of your blood pressure daily for one week. Then, send a MyChart message with your readings..  It is best to check your blood pressure 2 hours after taking your medications.  Below are some tips that our clinical pharmacists share for home BP monitoring:          Rest 10 minutes before taking your blood pressure.          Don't smoke or drink  caffeinated beverages for at least 30 minutes before.          Take your blood pressure before (not after) you eat.          Sit comfortably with your back supported and both feet on the floor (don't cross your legs).          Elevate your arm to heart level on a table or a desk.          Use the proper sized cuff. It should fit smoothly and snugly around your bare upper arm. There should be enough room to slip a fingertip under the cuff. The bottom edge of the cuff should be 1 inch above the crease of the elbow.

## 2023-12-08 NOTE — Progress Notes (Signed)
 Cardiology Clinic Note   Date: 12/12/2023 ID: Michael Ingram, DOB 1971/02/05, MRN [id number]  Primary Cardiologist:  Lorine Bears, MD  Chief Complaint   Michael Ingram is a 53 y.o. male who presents to the clinic today for routine follow up.   Patient Profile   Michael Ingram is followed by Dr. Kirke Corin for the history outlined below.      Past medical history significant for: Palpitations/PVCs. Echo 08/30/2023: EF 55 to 60%.  No RWMA.  Mild LVH.  Normal RV size/function.  Mild MR. Hypertension. Hyperlipidemia.  Lipid panel 06/22/2023: LDL 191, HDL 104, TG 145, total 324. Tobacco abuse. Alcohol use.  In summary, was first evaluated by Dr. Kirke Corin on 07/12/2023 for palpitations. He reported a long history of hypertension and had not been on anti-hypertensives for over a year. 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 He had multiple ED visits for palpitations. Most recent visit at [facility]. He was discharged with Zio that had not yet been mailed back at the time of his visit. He was started on amlodipine and atorvastatin.  Echo was ordered but does not appear to be completed.     Patient was seen in November 2024 and had not started amlodipine or atorvastatin for fear of anaphylactic reaction (prior reaction to Lisinopril). Patient reported continued palpitations. He admitted to heavy daily drinking consuming a 5th of liquor over 2 days during the week and closer to a full 5th on the weekends. He also reported smoking a pack per day. He had worn a heart monitor that was ordered by [facility] ED but he may have lost it. BP was elevated at the time of his visit. He agreed to starting amlodipine. He wanted to look for heart monitor so he could send it in if he found it.   Patient was last seen in the office by me on 09/12/2023 for follow up after medication changes. At the time of his visit he stated he was tolerating amlodipine well but had not taken  it for 3 days as he had been out of town and forgot it. He had reduced his alcohol consumption and was only drinking on the weekends. He reported reducing his smoking as well and was down to half a pack. BP was better controlled. He agreed to starting atorvastatin.      History of Present Illness    Today, patient reports he has not taken amlodipine through the weekend because he was out of town celebrating a family member's birthday. He reports heavy smoking and alcohol consumption as well as dietary indiscretion for the last several weekends. Patient denies shortness of breath, dyspnea on exertion, lower extremity edema, orthopnea or PND. No chest pain, pressure, or tightness. No palpitations.  BP is elevated today at 142/102 on intake. He denies headache, dizziness or vision changes. He has not been checking BP at home.  He has not started atorvastatin secondary to fear of having an allergic reaction.     ROS: All other systems reviewed and are otherwise negative except as noted in History of Present Illness.  EKGs/Labs Reviewed    EKG Interpretation Date/Time:  Monday December 12 2023 08:38:06 EDT Ventricular Rate:  88 PR Interval:  154 QRS Duration:  86 QT Interval:  358 QTC Calculation: 433 R Axis:   27  Text Interpretation: Normal sinus rhythm Moderate voltage criteria for LVH, may be normal variant ( Sokolow-Lyon , Cornell product ) Nonspecific T wave abnormality  When compared with ECG of 12-Jul-2023 15:06, No significant change was found Confirmed by Carlos Levering [phone]) on 12/12/2023 8:53:28 AM   04/17/2023: BUN 17; Creatinine, Ser 0.93; Potassium 3.5; Sodium 138   04/17/2023: Hemoglobin 10.9; WBC 5.9   04/17/2023: TSH 0.764   Risk Assessment/Calculations      HYPERTENSION CONTROL Vitals:   12/12/23 0833 12/12/23 1013  BP: (!) 142/102 (!) 140/100    The patient's blood pressure is elevated above target today.  In order to address the patient's elevated BP: Blood  pressure will be monitored at home to determine if medication changes need to be made.           Physical Exam    VS:  BP (!) 140/100 (BP Location: Left Arm, Patient Position: Sitting, Cuff Size: Normal)   Pulse 88   Ht 5\' 11"  (1.803 m)   Wt 158 lb 6.4 oz (71.8 kg)   SpO2 98%   BMI 22.09 kg/m  , BMI Body mass index is 22.09 kg/m.  GEN: Well nourished, well developed, in no acute distress. Neck: No JVD or carotid bruits. Cardiac:  RRR. No murmurs. No rubs or gallops.   Respiratory:  Respirations regular and unlabored. Clear to auscultation without rales, wheezing or rhonchi. GI: Soft, nontender, nondistended. Extremities: Radials/DP/PT 2+ and equal bilaterally. No clubbing or cyanosis. No edema.  Skin: Warm and dry, no rash. Neuro: Strength intact.  Assessment & Plan   Hypertension BP today 142/102 on intake and 140/100 on my recheck. Patient did not take medication over the weekend because he was out of town. He reports recent dietary indiscretion with high sodium meals frequently over the last few weekend. He denies headaches, dizziness or vision changes. He has not been taking BP at home. He states he needs a new cuff. Discussed the importance of BP control and taking medication consistently.  -MyChart 1 week of home BP readings.  -Continue amlodipine. -CBC and CMP today.    Hyperlipidemia LDL October 2024 191. Patient has not started atorvastatin as previously agreed secondary to fear of having an allergic reaction. Discussed the importance of cholesterol control. He again agrees to start taking it.  -Start atorvastatin 20 mg.   Palpitations Patient denies palpitations for the last several months. EKG shows NSR, HR 88. RRR on exam today.  -Continue to monitor.    Tobacco Abuse Patient reports recently smoking more heavily due to going out with family to celebrate birthdays. Discussed the importance of smoking cessation.   -Encourage continued gradual reduction in smoking  until complete cessation.   Alcohol Abuse Patient reports binge drinking on the weekends. He does not typically consume alcohol during the week. He has been drinking more heavily the past several weekends secondary to celebrating birthdays with family. He denies DTs or other adverse symptoms.  -Encourage complete cessation.   Disposition: CBC and CMP today. Return in 3 months or sooner as needed.          Signed, Etta Grandchild. Umberto Pavek, DNP, NP-C

## 2023-12-12 ENCOUNTER — Encounter: Payer: Self-pay | Admitting: Student

## 2023-12-12 ENCOUNTER — Ambulatory Visit: Payer: BC Managed Care – PPO | Attending: Student | Admitting: Student

## 2023-12-12 VITALS — BP 140/100 | HR 88 | Ht 71.0 in | Wt 158.4 lb

## 2023-12-12 DIAGNOSIS — Z720 Tobacco use: Secondary | ICD-10-CM | POA: Diagnosis not present

## 2023-12-12 DIAGNOSIS — Z79899 Other long term (current) drug therapy: Secondary | ICD-10-CM | POA: Diagnosis not present

## 2023-12-12 DIAGNOSIS — F1010 Alcohol abuse, uncomplicated: Secondary | ICD-10-CM

## 2023-12-12 DIAGNOSIS — R002 Palpitations: Secondary | ICD-10-CM

## 2023-12-12 DIAGNOSIS — I10 Essential (primary) hypertension: Secondary | ICD-10-CM | POA: Diagnosis not present

## 2023-12-12 MED ORDER — AMLODIPINE BESYLATE 5 MG PO TABS: 5.0000 mg | ORAL_TABLET | Freq: Every day | ORAL | 1 refills | Status: DC

## 2023-12-12 NOTE — Patient Instructions (Signed)
 Medication Instructions:  Your Physician recommend you continue on your current medication as directed.    *If you need a refill on your cardiac medications before your next appointment, please call your pharmacy*   Lab Work: Your provider would like for you to have following labs drawn today CBC and CMeT.   If you have labs (blood work) drawn today and your tests are completely normal, you will receive your results only by: MyChart Message (if you have MyChart) OR A paper copy in the mail If you have any lab test that is abnormal or we need to change your treatment, we will call you to review the results.   Follow-Up: At [facility], you and your health needs are our priority.  As part of our continuing mission to provide you with exceptional heart care, we have created designated Provider Care Teams.  These Care Teams include your primary Cardiologist (physician) and Advanced Practice Providers (APPs -  Physician Assistants and Nurse Practitioners) who all work together to provide you with the care you need, when you need it.  We recommend signing up for the patient portal called "MyChart".  Sign up information is provided on this After Visit Summary.  MyChart is used to connect with patients for Virtual Visits (Telemedicine).  Patients are able to view lab/test results, encounter notes, upcoming appointments, etc.  Non-urgent messages can be sent to your provider as well.   To learn more about what you can do with MyChart, go to [email or web address].    Your next appointment:   3 month(s)  Provider:   You may see Lorine Bears, MD or Carlos Levering, NP

## 2023-12-13 LAB — COMPREHENSIVE METABOLIC PANEL
ALT: 21 IU/L (ref 0–44)
AST: 32 IU/L (ref 0–40)
Albumin: 4.8 g/dL (ref 3.8–4.9)
Alkaline Phosphatase: 137 IU/L — ABNORMAL HIGH (ref 44–121)
BUN/Creatinine Ratio: 14 (ref 9–20)
BUN: 12 mg/dL (ref 6–24)
Bilirubin Total: <0.2 mg/dL (ref 0.0–1.2)
CO2: 22 mmol/L (ref 20–29)
Calcium: 9.1 mg/dL (ref 8.7–10.2)
Chloride: 103 mmol/L (ref 96–106)
Creatinine, Ser: 0.86 mg/dL (ref 0.76–1.27)
Globulin, Total: 2.9 g/dL (ref 1.5–4.5)
Glucose: 90 mg/dL (ref 70–99)
Potassium: 5.1 mmol/L (ref 3.5–5.2)
Sodium: 140 mmol/L (ref 134–144)
Total Protein: 7.7 g/dL (ref 6.0–8.5)
eGFR: 104 mL/min/{1.73_m2} (ref >59)

## 2023-12-13 LAB — CBC
Hematocrit: 40.3 % (ref 37.5–51.0)
Hemoglobin: 13.4 g/dL (ref 13.0–17.7)
MCH: 31.5 pg (ref 26.6–33.0)
MCHC: 33.3 g/dL (ref 31.5–35.7)
MCV: 95 fL (ref 79–97)
Platelets: 222 10*3/uL (ref 150–450)
RBC: 4.25 x10E6/uL (ref 4.14–5.80)
RDW: 13.5 % (ref 11.6–15.4)
WBC: 5.6 10*3/uL (ref 3.4–10.8)

## 2023-12-19 ENCOUNTER — Encounter: Payer: Self-pay | Admitting: Emergency Medicine

## 2024-01-18 ENCOUNTER — Encounter: Payer: Self-pay | Admitting: Nurse Practitioner

## 2024-01-18 ENCOUNTER — Ambulatory Visit: Attending: Nurse Practitioner | Admitting: Nurse Practitioner

## 2024-01-18 ENCOUNTER — Ambulatory Visit

## 2024-01-18 ENCOUNTER — Encounter: Payer: Self-pay | Admitting: *Deleted

## 2024-01-18 VITALS — BP 150/102 | HR 84 | Ht 71.0 in | Wt 155.2 lb

## 2024-01-18 DIAGNOSIS — F1910 Other psychoactive substance abuse, uncomplicated: Secondary | ICD-10-CM | POA: Diagnosis not present

## 2024-01-18 DIAGNOSIS — I10 Essential (primary) hypertension: Secondary | ICD-10-CM | POA: Diagnosis not present

## 2024-01-18 DIAGNOSIS — R002 Palpitations: Secondary | ICD-10-CM

## 2024-01-18 MED ORDER — CARVEDILOL 3.125 MG PO TABS: 3.1250 mg | ORAL_TABLET | Freq: Two times a day (BID) | ORAL | 3 refills | Status: AC

## 2024-01-18 NOTE — Progress Notes (Signed)
 Office Visit    Patient Name: Michael Ingram Date of Encounter: 01/18/2024  Primary Care Provider:  Patient, No Pcp Per Primary Cardiologist:  Antionette Kirks, MD  Chief Complaint    53 y.o. male with history of hypertension, palpitations, and polysubstance abuse, who presents for follow-up related to palpitations.  Past Medical History  Subjective   Past Medical History:  Diagnosis Date   Alcohol abuse    Hypertension    Marijuana use    Mitral regurgitation    a. 08/2023 Echo: EF 55-60%, no rwma, nl RV fxn, mild MR.   Palpitations    Tobacco abuse    Past Surgical History:  Procedure Laterality Date   INTUBATION-ENDOTRACHEAL WITH TRACHEOSTOMY STANDBY N/A 10/09/2019   Procedure: INTUBATION-ENDOTRACHEAL WITH TRACHEOSTOMY STANDBY;  Surgeon: Lesly Raspberry, MD;  Location: [facility] ORS;  Service: ENT;  Laterality: N/A;    Allergies  Allergies  Allergen Reactions   Ace Inhibitors Swelling   Lisinopril Anaphylaxis    Angioedema requiring intubation      History of Present Illness      53 y.o. y/o male with the above past medical history including hypertension, palpitations, and polysubstance abuse (half a pack of cigarettes a day, 1 marijuana cigarette daily, 2-3 shots of alcohol most days with binge drinking on the weekends).  He previously established care with Michael Ingram in October 2024 following an ED visit for palpitations at which time he was prescribed a ZIO monitor.  He states he wore it for a few days but it fell off and he never returned.  Palpitations subsequently resolved.  Echocardiogram in December 2024 showed normal LV function with mild MR.   More recently, Mr. Lezon has been seen in the setting of difficult to manage hypertension and noncompliance.  He was last seen on March 24 and advised to continue amlodipine  and submit home blood pressure recordings for further adjustments.  He has not been checking his blood pressure at home and therefore did not  submit any pressures.  He drank heavier than usual this past weekend and since then has been having palpitations which he describes as skipped beats occurring a few times per minute.  These are very bothersome to him.  He denies chest pain, dyspnea, palpitations, PND, orthopnea, dizziness, syncope, edema, or early satiety.  He has been his work the past 2 days out of concern related to his palpitations. Objective  Home Medications    Current Outpatient Medications  Medication Sig Dispense Refill   amLODipine  (NORVASC ) 5 MG tablet Take 1 tablet (5 mg total) by mouth daily. 90 tablet 1   atorvastatin  (LIPITOR) 20 MG tablet Take 1 tablet (20 mg total) by mouth daily. 90 tablet 3   carvedilol (COREG) 3.125 MG tablet Take 1 tablet (3.125 mg total) by mouth 2 (two) times daily. 180 tablet 3   No current facility-administered medications for this visit.     Physical Exam    VS:  BP (!) 148/86   Pulse 84   Ht 5\' 11"  (1.803 m)   Wt 155 lb 3.2 oz (70.4 kg)   SpO2 99%   BMI 21.65 kg/m  , BMI Body mass index is 21.65 kg/m.     Vitals:   01/18/24 1509 01/18/24 1714  BP: (!) 148/86 (!) 150/102  Pulse: 84   SpO2: 99%       GEN: Well nourished, well developed, in no acute distress. HEENT: normal. Neck: Supple, no JVD, carotid bruits, or masses. Cardiac:  RRR, no murmurs, rubs, or gallops. No clubbing, cyanosis, edema.  Radials 2+/PT 2+ and equal bilaterally.  Respiratory:  Respirations regular and unlabored, clear to auscultation bilaterally. GI: Soft, nontender, nondistended, BS + x 4. MS: no deformity or atrophy. Skin: warm and dry, no rash. Neuro:  Strength and sensation are intact. Psych: Normal affect.  Accessory Clinical Findings    ECG personally reviewed by me today - EKG Interpretation Date/Time:  Wednesday January 18 2024 15:04:04 EDT Ventricular Rate:  84 PR Interval:  138 QRS Duration:  82 QT Interval:  382 QTC Calculation: 451 R Axis:   40  Text Interpretation: Normal  sinus rhythm Biatrial enlargement ST & T wave abnormality, consider lateral ischemia Confirmed by Michael Ingram [phone]) on 01/18/2024 3:14:30 PM  - no acute changes.  Lab Results  Component Value Date   WBC 5.6 12/12/2023   HGB 13.4 12/12/2023   HCT 40.3 12/12/2023   MCV 95 12/12/2023   PLT 222 12/12/2023   Lab Results  Component Value Date   CREATININE 0.86 12/12/2023   BUN 12 12/12/2023   NA 140 12/12/2023   K 5.1 12/12/2023   CL 103 12/12/2023   CO2 22 12/12/2023   Lab Results  Component Value Date   ALT 21 12/12/2023   AST 32 12/12/2023   ALKPHOS 137 (H) 12/12/2023   BILITOT <0.2 12/12/2023   Lab Results  Component Value Date   TRIG 123 10/13/2019    Lab Results  Component Value Date   TSH 0.764 04/17/2023       Assessment & Plan    1.  Palpitations: Patient with prior history of palpitations which seem to be at least partially linked to binge drinking episodes on weekends.  He has had more frequent isolated skipped beats occurring multiple times admitted over the past 3 days.  Follow-up TSH, basic metabolic panel, and magnesium  today.  Adding carvedilol 3.125 mg twice daily.  Placing ZIO XT x 7 days.  Stressed the importance of substance avoidance, especially alcohol as this seems to trigger his episodes.  2.  Primary hypertension: Says he has been compliant with amlodipine .  In the setting of more frequent palpitations, also adding carvedilol.  Stressed the importance of compliance and substance avoidance.  3.  Polysubstance use: Smoking cigarettes and marijuana as well as drinking heavily on the weekends and some throughout the week as well.  Stressed the importance of cessation of all substances as these are very likely contributing to his symptoms of palpitations and ongoing hypertension.  Patient recognizes that he needs to quit but does not currently have a plan to stop or cut back.  4.  Disposition: Follow-up basic metabolic panel, magnesium , TSH today.   Follow-up 7-day ZIO monitor.  He has follow-up scheduled for here in June.  Michael Pintos, NP 01/18/2024, 5:07 PM

## 2024-01-18 NOTE — Patient Instructions (Signed)
 Medication Instructions:  START Carvedilol 3.125 mg twice daily  *If you need a refill on your cardiac medications before your next appointment, please call your pharmacy*  Lab Work: Your provider would like for you to have the following labs today: Bmet, Magnesium , TSH  If you have labs (blood work) drawn today and your tests are completely normal, you will receive your results only by: MyChart Message (if you have MyChart) OR A paper copy in the mail If you have any lab test that is abnormal or we need to change your treatment, we will call you to review the results.  Testing/Procedures: None ordered  Follow-Up: At [facility], you and your health needs are our priority.  As part of our continuing mission to provide you with exceptional heart care, our providers are all part of one team.  This team includes your primary Cardiologist (physician) and Advanced Practice Providers or APPs (Physician Assistants and Nurse Practitioners) who all work together to provide you with the care you need, when you need it.  Your next appointment:   Keep follow up as scheduled.   Other Instructions ZIO XT- Long Term Monitor Instructions  Your physician has requested you wear a ZIO patch monitor for 7 days.  This is a single patch monitor. [employer] supplies one patch monitor per enrollment. Additional stickers are not available. Please do not apply patch if you will be having a Nuclear Stress Test,  Echocardiogram, Cardiac CT, MRI, or Chest Xray during the period you would be wearing the  monitor. The patch cannot be worn during these tests. You cannot remove and re-apply the  ZIO XT patch monitor.  Your ZIO patch monitor will be mailed 3 day USPS to your address on file. It may take 3-5 days  to receive your monitor after you have been enrolled.  Once you have received your monitor, please review the enclosed instructions. Your monitor  has already been registered assigning a specific  monitor serial # to you.  Billing and Patient Assistance Program Information  We have supplied [employer] with any of your insurance information on file for billing purposes. [employer] offers a sliding scale Patient Assistance Program for patients that do not have  insurance, or whose insurance does not completely cover the cost of the ZIO monitor.  You must apply for the Patient Assistance Program to qualify for this discounted rate.  To apply, please call [employer] at [phone], select option 4, select option 2, ask to apply for  Patient Assistance Program. [name] will ask your household income, and how many people  are in your household. They will quote your out-of-pocket cost based on that information.  [employer] will also be able to set up a 49-month, interest-free payment plan if needed.  Applying the monitor   Shave hair from upper left chest.  Hold abrader disc by orange tab. Rub abrader in 40 strokes over the upper left chest as  indicated in your monitor instructions.  Clean area with 4 enclosed alcohol pads. Let dry.  Apply patch as indicated in monitor instructions. Patch will be placed under collarbone on left  side of chest with arrow pointing upward.  Rub patch adhesive wings for 2 minutes. Remove white label marked "1". Remove the white  label marked "2". Rub patch adhesive wings for 2 additional minutes.  While looking in a mirror, press and release button in center of patch. A small green light will  flash 3-4 times. This will be your only indicator  that the monitor has been turned on.  Do not shower for the first 24 hours. You may shower after the first 24 hours.  Press the button if you feel a symptom. You will hear a small click. Record Date, Time and  Symptom in the Patient Logbook.  When you are ready to remove the patch, follow instructions on the last 2 pages of Patient  Logbook. Stick patch monitor onto the last page of Patient Logbook.  Place Patient Logbook in the  blue and white box. Use locking tab on box and tape box closed  securely. The blue and white box has prepaid postage on it. Please place it in the mailbox as  soon as possible. Your physician should have your test results approximately 7 days after the  monitor has been mailed back to [facility].  Call [facility] Customer Care at [phone] if you have questions regarding  your ZIO XT patch monitor. Call them immediately if you see an orange light blinking on your  monitor.  If your monitor falls off in less than 4 days, contact our Monitor department at [phone].  If your monitor becomes loose or falls off after 4 days call [employer] at [phone] for  suggestions on securing your monitor

## 2024-01-19 LAB — BASIC METABOLIC PANEL WITH GFR
BUN/Creatinine Ratio: 9 (ref 9–20)
BUN: 8 mg/dL (ref 6–24)
CO2: 22 mmol/L (ref 20–29)
Calcium: 10.3 mg/dL — ABNORMAL HIGH (ref 8.7–10.2)
Chloride: 96 mmol/L (ref 96–106)
Creatinine, Ser: 0.85 mg/dL (ref 0.76–1.27)
Glucose: 101 mg/dL — ABNORMAL HIGH (ref 70–99)
Potassium: 5 mmol/L (ref 3.5–5.2)
Sodium: 137 mmol/L (ref 134–144)
eGFR: 105 mL/min/{1.73_m2} (ref >59)

## 2024-01-19 LAB — MAGNESIUM: Magnesium: 1.6 mg/dL (ref 1.6–2.3)

## 2024-01-19 LAB — TSH: TSH: 1.21 u[IU]/mL (ref 0.450–4.500)

## 2024-01-23 ENCOUNTER — Encounter: Payer: Self-pay | Admitting: *Deleted

## 2024-02-17 ENCOUNTER — Telehealth: Payer: Self-pay | Admitting: Cardiovascular Disease

## 2024-02-17 NOTE — Telephone Encounter (Signed)
Patient was calling back to speak to the nurse. Please advise

## 2024-02-17 NOTE — Telephone Encounter (Signed)
 Quite a bit of variability in the numbers.  Recommend continuing to check blood pressure once a day and recording.  In 1 to 2 weeks, he may submit blood pressure and heart rate trends via MyChart so that we can make a more informed decision.

## 2024-02-17 NOTE — Telephone Encounter (Signed)
 Pt c/o BP issue: STAT if pt c/o blurred vision, one-sided weakness or slurred speech.  STAT if BP is GREATER than 180/120 TODAY.  STAT if BP is LESS than 90/60 and SYMPTOMATIC TODAY  1. What is your BP concern? High  2. Have you taken any BP medication today?yes  3. What are your last 5 BP readings?187/110, 177/117,150/99  4. Are you having any other symptoms (ex. Dizziness, headache, blurred vision, passed out)? headache

## 2024-02-17 NOTE — Telephone Encounter (Signed)
 Called patient, advised of message below regarding blood pressure concerns. Recent OV in April, recommended starting Carvedilol  3.125 mg BID, in addition to his Amlodipine  5 mg daily, and his Atorvastatin  as well.   Patient states that he checks his blood pressure pretty regular, before medications and after medications.  Recently noticed blood pressure going up and down. Only symptoms to report is a headache. He denies palpitations (which he states has improved).   Patient states this morning example:   Blood pressure this morning around 5 AM (when he normally wakes, and takes his medications) was 121/81, he states he got ready for work left around 7:30- rechecked blood pressure it was 154/112 HR 92, then he just rechecked again at work about 15 minutes ago it was 117/62 HR 84. He states he does check his blood pressure cuff against one at the drug store to advise if correct and they all match up.   He states yesterday blood pressure was 187/110 before medications, then about 7:00 AM it was 164/98 and then a bit later was down to 150/99. He states his HR is normally around 81, 84.    He is just concerned with it jumping around so much. He did mention that he has become pretty consistent with taking his medications regularly now.    Advised I would route to NP to recently seen patient.   Thank you!

## 2024-02-17 NOTE — Telephone Encounter (Signed)
 Patient was made aware and verbalized understanding. He also inquired about obtaining a work excuse note for missing Tuesday, 02/14/24, and for leaving work early today due to palpitations. Will forward the request to [facility] for review.  Patient also asked if someone from our office could assist with placing his heart monitor. Nurse will forward the message to the monitor nurse to inquire if she is available to place the monitor on Monday

## 2024-02-20 NOTE — Telephone Encounter (Signed)
 Thank you for follow-up.  Agree re: placing zio.  We will not be providing a work note.

## 2024-02-20 NOTE — Telephone Encounter (Signed)
 Spoke with patient and he is unable to come in for monitor placement today or any other day this week. Advised that he could follow the step by step instructions in the box and if he is still unsure to call back and we can schedule for another day. He was appreciative with no further questions at this time.

## 2024-03-14 ENCOUNTER — Ambulatory Visit: Admitting: Student

## 2024-03-15 NOTE — Progress Notes (Deleted)
 Cardiology Clinic Note   Date: 03/15/2024 ID: Michael Ingram, DOB [birth year], MRN [id number]  Primary Cardiologist:  Deatrice Cage, MD  Chief Complaint   Michael Ingram is a 53 y.o. male who presents to the clinic today for ***  Patient Profile   Michael Ingram is followed by Dr. Cage for the history outlined below.      Past medical history significant for: Palpitations/PVCs. Echo 08/30/2023: EF 55 to 60%.  No RWMA.  Mild LVH.  Normal RV size/function.  Mild MR. Hypertension. Hyperlipidemia.  Lipid panel 06/22/2023: LDL 191, HDL 104, TG 145, total 324. Tobacco abuse. Alcohol use.  In summary, was first evaluated by Dr. Cage on 07/12/2023 for palpitations. He reported a long history of hypertension and had not been on anti-hypertensives for over a year. 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 He had multiple ED visits for palpitations. Most recent visit at [facility]. He was discharged with Zio that had not yet been mailed back at the time of his visit. He was started on amlodipine  and atorvastatin .  Echo was ordered but does not appear to be completed.     Patient was seen in November 2024 and had not started amlodipine  or atorvastatin  for fear of anaphylactic reaction (prior reaction to Lisinopril). Patient reported continued palpitations. He admitted to heavy daily drinking consuming a 5th of liquor over 2 days during the week and closer to a full 5th on the weekends. He also reported smoking a pack per day. He had worn a heart monitor that was ordered by [facility] ED but he may have lost it. BP was elevated at the time of his visit. He agreed to starting amlodipine . He wanted to look for heart monitor so he could send it in if he found it.   Patient was last seen in the office by me on 09/12/2023 for follow up after medication changes. At the time of his visit he stated he was tolerating amlodipine  well but had not taken it for 3 days  as he had been out of town and forgot it. He had reduced his alcohol consumption and was only drinking on the weekends. He reported reducing his smoking as well and was down to half a pack. BP was better controlled. He agreed to starting atorvastatin .     Patient was last seen in the office by Medford Meager, NP on 01/18/2024 for evaluation of increased palpitations.  He reported drinking heavier than usual the prior weekend and having increased palpitations described as skipped beats occurring a few times per minute.  Propranolol was added ZIO monitor was ordered.  Patient was educated on abstinence of alcohol and other substances.     History of Present Illness    Today, patient ***  Hypertension BP today *** Home BP *** -Continue amlodipine , carvedilol .    Hyperlipidemia LDL October 2024 191. Patient has not started atorvastatin  as previously agreed secondary to fear of having an allergic reaction. *** -Start atorvastatin  20 mg.    Palpitations Patient seen in April for increased palpitations. He was started on carvedilol  and 7 day Zio was ordered. Patient *** -Continue to monitor.    Tobacco Abuse Patient *** -Encourage continued gradual reduction in smoking until complete cessation.   Alcohol Abuse Patient *** -Encourage complete cessation.   ROS: All other systems reviewed and are otherwise negative except as noted in History of Present Illness.  EKGs/Labs Reviewed  12/12/2023: ALT 21; AST 32 01/18/2024: BUN 8; Creatinine, Ser 0.85; Potassium 5.0; Sodium 137   12/12/2023: Hemoglobin 13.4; WBC 5.6   01/18/2024: TSH 1.210   No results found for requested labs within last 365 days.  ***  Risk Assessment/Calculations    {Does this patient have ATRIAL FIBRILLATION?:[phone]} No BP recorded.  {Refresh Note OR Click here to enter BP  :1}***        Physical Exam    VS:  There were no vitals taken for this visit. , BMI There is no height or weight on file to  calculate BMI.  GEN: Well nourished, well developed, in no acute distress. Neck: No JVD or carotid bruits. Cardiac: *** RRR. *** No murmur. No rubs or gallops.   Respiratory:  Respirations regular and unlabored. Clear to auscultation without rales, wheezing or rhonchi. GI: Soft, nontender, nondistended. Extremities: Radials/DP/PT 2+ and equal bilaterally. No clubbing or cyanosis. No edema ***  Skin: Warm and dry, no rash. Neuro: Strength intact.  Assessment & Plan   ***  Disposition: ***     {Are you ordering a CV Procedure (e.g. stress test, cath, DCCV, TEE, etc)?   Press F2        :[id number]}   Signed, Barnie HERO. Braylyn Eye, DNP, NP-C

## 2024-03-19 ENCOUNTER — Ambulatory Visit: Attending: Student | Admitting: Student

## 2024-07-12 ENCOUNTER — Other Ambulatory Visit: Payer: Self-pay | Admitting: Student

## 2024-07-16 NOTE — Telephone Encounter (Signed)
 LVM to schedule f/u appt

## 2024-07-16 NOTE — Telephone Encounter (Signed)
 Please contact pt for future appointment. Pt due for follow up.

## 2024-08-24 NOTE — Progress Notes (Deleted)
 Cardiology Clinic Note   Date: 08/24/2024 ID: Michael Ingram, DOB [birth year], MRN [id number]  Primary Cardiologist:  Deatrice Cage, MD  Chief Complaint   Michael Ingram is a 53 y.o. male who presents to the clinic today for ***  Patient Profile   Michael Ingram is followed by *** for the history outlined below.      Past medical history significant for: Palpitations/PVCs. Echo 08/30/2023: EF 55 to 60%.  No RWMA.  Mild LVH.  Normal RV size/function.  Mild MR. Hypertension. Hyperlipidemia.  Lipid panel 06/22/2023: LDL 191, HDL 104, TG 145, total 324. Tobacco abuse. Alcohol use.  In summary, was first evaluated by Dr. Cage on 07/12/2023 for palpitations. He reported a long history of hypertension and had not been on anti-hypertensives for over a year. 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 He had multiple ED visits for palpitations. Most recent visit at [facility]. He was discharged with Zio that had not yet been mailed back at the time of his visit. He was started on amlodipine  and atorvastatin .  Echo was ordered but does not appear to be completed.     Patient was seen in November 2024 and had not started amlodipine  or atorvastatin  for fear of anaphylactic reaction (prior reaction to Lisinopril). Patient reported continued palpitations. He admitted to heavy daily drinking consuming a 5th of liquor over 2 days during the week and closer to a full 5th on the weekends. He also reported smoking a pack per day. He had worn a heart monitor that was ordered by [facility] ED but he may have lost it. BP was elevated at the time of his visit. He agreed to starting amlodipine . He wanted to look for heart monitor so he could send it in if he found it.   Patient was seen in the clinic on 09/12/2023 for follow up after medication changes. At the time of his visit he stated he was tolerating amlodipine  well but had not taken it for 3 days as he had been out  of town and forgot it. He had reduced his alcohol consumption and was only drinking on the weekends. He reported reducing his smoking as well and was down to half a pack. BP was better controlled. He agreed to starting atorvastatin .  Upon follow up in March 2025 his BP was elevated secondary to not taking amlodipine  through the weekend. He admitted to drinking and smoking heavily for the last several weekends as well as dietary indiscretion. BP was elevated to 142/102. He was instructed to send in 1 week of BP readings.   Patient was last seen in the office by Medford Meager, NP on 01/18/2024 for evaluation of palpitations.  He had not been checking BP at home.  He reported drinking heavier than usual the previous weekend and developing increased palpitations described as skipped beats a few times per minute that were very bothersome to him.  7-day ZIO was ordered but does not appear to have been completed.  He was started on carvedilol  3.125 mg twice daily.  He contacted the office on 02/17/2024 to report fluctuating BP.  He was getting variable readings of 154/112 and 117/62.  He was instructed to continue monitoring BP and sending more readings in 1 to 2 weeks.  Patient also asked if office could assist patient in placing heart monitor.  Attempt was made for office visit but he was unable to accommodate that date.  He was instructed to follow  step-by-step instructions on the box and if any issues call the office.     History of Present Illness    Today, patient ***  Hypertension BP today *** -Continue amlodipine , carvedilol .   Hyperlipidemia LDL 191 October 2024. Patient*** - Continue atorvastatin . - Lipid panel***   Palpitations Patient with episode of increased palpitations in late April 2025.  Patient*** -***   Tobacco Abuse Patient*** -Encourage continued gradual reduction in smoking until complete cessation.   Alcohol Abuse Patient *** -Encourage complete cessation.   ROS: All other  systems reviewed and are otherwise negative except as noted in History of Present Illness.  EKGs/Labs Reviewed        12/12/2023: ALT 21; AST 32 01/18/2024: BUN 8; Creatinine, Ser 0.85; Potassium 5.0; Sodium 137   12/12/2023: Hemoglobin 13.4; WBC 5.6   01/18/2024: TSH 1.210   No results found for requested labs within last 365 days.  ***  Risk Assessment/Calculations    {Does this patient have ATRIAL FIBRILLATION?:[id number]} No BP recorded.  {Refresh Note OR Click here to enter BP  :1}***        Physical Exam    VS:  There were no vitals taken for this visit. , BMI There is no height or weight on file to calculate BMI.  GEN: Well nourished, well developed, in no acute distress. Neck: No JVD or carotid bruits. Cardiac: *** RRR. *** No murmur. No rubs or gallops.   Respiratory:  Respirations regular and unlabored. Clear to auscultation without rales, wheezing or rhonchi. GI: Soft, nontender, nondistended. Extremities: Radials/DP/PT 2+ and equal bilaterally. No clubbing or cyanosis. No edema ***  Skin: Warm and dry, no rash. Neuro: Strength intact.  Assessment & Plan   ***  Disposition: ***     {Are you ordering a CV Procedure (e.g. stress test, cath, DCCV, TEE, etc)?   Press F2        :[id number]}   Signed, Barnie HERO. Aizza Santiago, DNP, NP-C

## 2024-08-27 ENCOUNTER — Ambulatory Visit: Attending: Student | Admitting: Student
